# Patient Record
Sex: Male | Born: 1995 | Race: Black or African American | Hispanic: No | Marital: Single | Smoking: Never smoker
Health system: Southern US, Community
[De-identification: ages and names within clinical notes are randomized; demographics above are authoritative.]

## PROBLEM LIST (undated history)

## (undated) DIAGNOSIS — N4400 Torsion of testis, unspecified: Secondary | ICD-10-CM

## (undated) DIAGNOSIS — F419 Anxiety disorder, unspecified: Secondary | ICD-10-CM

## (undated) HISTORY — PX: WISDOM TOOTH EXTRACTION: SHX21

---
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
## 2016-09-10 ENCOUNTER — Emergency Department (HOSPITAL_COMMUNITY)
Admission: EM | Admit: 2016-09-10 | Discharge: 2016-09-10 | Disposition: A | Discharge status: Home / Self Care | Payer: Medicaid Other | Attending: Emergency Medicine | Admitting: Emergency Medicine

## 2016-09-10 ENCOUNTER — Encounter (HOSPITAL_COMMUNITY): Payer: Self-pay

## 2016-09-10 ENCOUNTER — Emergency Department (HOSPITAL_COMMUNITY): Payer: Medicaid Other

## 2016-09-10 DIAGNOSIS — F419 Anxiety disorder, unspecified: Secondary | ICD-10-CM | POA: Diagnosis not present

## 2016-09-10 DIAGNOSIS — F909 Attention-deficit hyperactivity disorder, unspecified type: Secondary | ICD-10-CM | POA: Diagnosis not present

## 2016-09-10 DIAGNOSIS — F410 Panic disorder [episodic paroxysmal anxiety] without agoraphobia: Secondary | ICD-10-CM

## 2016-09-10 LAB — CBC
HCT: 44.3 % (ref 39.0–52.0)
Hemoglobin: 15 g/dL (ref 13.0–17.0)
MCH: 32.3 pg (ref 26.0–34.0)
MCHC: 33.9 g/dL (ref 30.0–36.0)
MCV: 95.3 fL (ref 78.0–100.0)
Platelets: 204 10*3/uL (ref 150–400)
RBC: 4.65 MIL/uL (ref 4.22–5.81)
RDW: 11.6 % (ref 11.5–15.5)
WBC: 6.7 10*3/uL (ref 4.0–10.5)

## 2016-09-10 LAB — BASIC METABOLIC PANEL
Anion gap: 10 (ref 5–15)
BUN: 17 mg/dL (ref 6–20)
CO2: 22 mmol/L (ref 22–32)
Calcium: 10 mg/dL (ref 8.9–10.3)
Chloride: 108 mmol/L (ref 101–111)
Creatinine, Ser: 1.04 mg/dL (ref 0.61–1.24)
GFR calc Af Amer: >60 mL/min (ref >60)
GFR calc non Af Amer: >60 mL/min (ref >60)
Glucose, Bld: 125 mg/dL — ABNORMAL HIGH (ref 65–99)
Potassium: 3.6 mmol/L (ref 3.5–5.1)
Sodium: 140 mmol/L (ref 135–145)

## 2016-09-10 LAB — TSH: TSH: 0.489 u[IU]/mL (ref 0.350–4.500)

## 2016-09-10 MED ORDER — ALPRAZOLAM 0.25 MG PO TABS: 0.2500 mg | ORAL_TABLET | Freq: Two times a day (BID) | ORAL | 0 refills | Status: DC | PRN

## 2016-09-10 NOTE — ED Provider Notes (Signed)
[facility]-EMERGENCY DEPT Provider Note   CSN: [id number] Arrival date & time: 09/10/16  1620     History   Chief Complaint Chief Complaint  Patient presents with  . anxiety/ Heart racing    HPI Tyler Mcdowell is a 21 y.o. male.  HPI     Pt with hx anxiety, PTSD p/w episode of heart racing, chest tightness, crying, feeling emotional, N/V, generalized weakness.  He called his mother and when she got to him he was leaning on the steering wheel of his car crying and asked her "am I dead? Am I in a coma?"  The symptoms lasted about 2 hours.  Pt has hx marijuana use that caused hallucinations.  He denies any recent drug or alcohol use.  He takes Vitamin B complex and fish oil for anxiety but has not been taking it as much this week.  Was seeing a therapist once a month but has not gone in two months.  Currently feels generalized weakness but denies any CP or palpitations.  Denies recent immobilization, leg swelling, family hx of blood clots, early CAD, sudden cardiac death.    History reviewed. No pertinent past medical history.  Patient Active Problem List   Diagnosis Date Noted  . ADHD 10/23/2006    History reviewed. No pertinent surgical history.     Home Medications    Prior to Admission medications   Medication Sig Start Date End Date Taking? Authorizing Provider  ALPRAZolam (XANAX) 0.25 MG tablet Take 1 tablet (0.25 mg total) by mouth 2 (two) times daily as needed for anxiety. 09/10/16   Trixie DredgeEmily Saphia Vanderford, PA-C  cetirizine (ZYRTEC) 10 MG tablet Take 1 tablet (10 mg total) by mouth daily. For 2 weeks 02/27/15   Ree ShayJamie Deis, MD  dextromethorphan-guaiFENesin [facility](MUCINEX DM) 30-600 MG 12hr tablet Take 1 tab PO BID x 3-5 days then BID prn 05/10/15   Lowanda FosterMindy Brewer, NP  fluticasone (FLONASE) 50 MCG/ACT nasal spray Place 2 sprays into both nostrils daily. 05/10/15   Lowanda FosterMindy Brewer, NP  ibuprofen (ADVIL,MOTRIN) 600 MG tablet Take 1 tablet (600 mg total) by mouth every 8 (eight) hours as needed for  moderate pain. 04/15/14   Benjiman CoreNathan Pickering, MD  pseudoephedrine (SUDAFED) 30 MG tablet Take 1 tablet (30 mg total) by mouth every 6 (six) hours as needed for congestion. 02/27/15   Ree ShayJamie Deis, MD    Family History No family history on file.  Social History Social History  Substance Use Topics  . Smoking status: Never Smoker  . Smokeless tobacco: Never Used  . Alcohol use No     Allergies   Patient has no known allergies.   Review of Systems Review of Systems  All other systems reviewed and are negative.    Physical Exam Updated Vital Signs BP 125/74   Pulse 70   Temp 97.8 F (36.6 C) (Oral)   Resp 22   SpO2 100%   Physical Exam  Constitutional: He appears well-developed and well-nourished. No distress.  HENT:  Head: Normocephalic and atraumatic.  Neck: Neck supple.  Cardiovascular: Normal rate and regular rhythm.   Pulmonary/Chest: Effort normal and breath sounds normal. No respiratory distress. He has no wheezes. He has no rales.  Abdominal: Soft. He exhibits no distension and no mass. There is no tenderness. There is no rebound and no guarding.  Musculoskeletal: He exhibits no edema.  Neurological: He is alert. He exhibits normal muscle tone.  Skin: He is not diaphoretic.  Nursing note and vitals reviewed.  ED Treatments / Results  Labs (all labs ordered are listed, but only abnormal results are displayed) Labs Reviewed  BASIC METABOLIC PANEL - Abnormal; Notable for the following:       Result Value   Glucose, Bld 125 (*)    All other components within normal limits  CBC  TSH    EKG  EKG Interpretation None       Radiology Dg Chest 2 View  Result Date: 09/10/2016 CLINICAL DATA:  Palpitations, chest tightness EXAM: CHEST  2 VIEW COMPARISON:  None. FINDINGS: Lungs are clear.  No pleural effusion or pneumothorax. The heart is normal in size. Visualized osseous structures are within normal limits. IMPRESSION: Normal chest radiographs.  Electronically Signed   By: Charline Bills M.D.   On: 09/10/2016 19:22    Procedures Procedures (including critical care time)  Medications Ordered in ED Medications - No data to display   Initial Impression / Assessment and Plan / ED Course  I have reviewed the triage vital signs and the nursing notes.  Pertinent labs & imaging results that were available during my care of the patient were reviewed by me and considered in my medical decision making (see chart for details).     Afebrile, nontoxic patient with anxiety attack, likely exacerbated by large amount of caffeine intake today.  Workup reassuring.  Pt and mother aware TSH pending, will follow up.   D/C home with #6 xanax 0.25mg .  PCP and therapy follow up.    Discussed result, findings, treatment, and follow up  with patient.  Pt given return precautions.  Pt verbalizes understanding and agrees with plan.       Final Clinical Impressions(s) / ED Diagnoses   Final diagnoses:  Anxiety attack    New Prescriptions Discharge Medication List as of 09/10/2016  7:41 PM    START taking these medications   Details  ALPRAZolam (XANAX) 0.25 MG tablet Take 1 tablet (0.25 mg total) by mouth 2 (two) times daily as needed for anxiety., Starting Sun 09/10/2016, Print         [place], PA-C 09/10/16 1953    Raeford Razor, MD 09/11/16 1359

## 2016-09-10 NOTE — ED Notes (Signed)
ED Provider at bedside. 

## 2016-09-10 NOTE — ED Triage Notes (Addendum)
Patient complains of heart pounding, nausea, GERD symptoms and crying after leaving church today. States that he was so weak mother had to come pick him up. Patient has had increased stress and thinks related. On arrival appears anxious. No suicidal/homicidal thoughts. Alert and oriented, VSS. States anxiety symptoms in past

## 2016-09-10 NOTE — Discharge Instructions (Signed)
Read the information below.  Use the prescribed medication as directed.  Please discuss all new medications with your pharmacist.  You may return to the Emergency Department at any time for worsening condition or any new symptoms that concern you.   If you develop worsening chest pain, shortness of breath, fever, you pass out, or become weak or dizzy, return to the ER for a recheck.    °

## 2016-10-25 ENCOUNTER — Encounter (HOSPITAL_COMMUNITY): Payer: Self-pay

## 2016-10-25 ENCOUNTER — Emergency Department (HOSPITAL_COMMUNITY)
Admission: EM | Admit: 2016-10-25 | Discharge: 2016-10-25 | Disposition: A | Discharge status: Home / Self Care | Payer: Medicaid Other | Attending: Emergency Medicine | Admitting: Emergency Medicine

## 2016-10-25 DIAGNOSIS — R1084 Generalized abdominal pain: Secondary | ICD-10-CM | POA: Insufficient documentation

## 2016-10-25 DIAGNOSIS — R109 Unspecified abdominal pain: Secondary | ICD-10-CM | POA: Diagnosis present

## 2016-10-25 DIAGNOSIS — F909 Attention-deficit hyperactivity disorder, unspecified type: Secondary | ICD-10-CM | POA: Diagnosis not present

## 2016-10-25 HISTORY — DX: Torsion of testis, unspecified: N44.00

## 2016-10-25 HISTORY — DX: Anxiety disorder, unspecified: F41.9

## 2016-10-25 LAB — CBC
HCT: 43.3 % (ref 39.0–52.0)
Hemoglobin: 15 g/dL (ref 13.0–17.0)
MCH: 32.6 pg (ref 26.0–34.0)
MCHC: 34.6 g/dL (ref 30.0–36.0)
MCV: 94.1 fL (ref 78.0–100.0)
Platelets: 198 10*3/uL (ref 150–400)
RBC: 4.6 MIL/uL (ref 4.22–5.81)
RDW: 11.5 % (ref 11.5–15.5)
WBC: 7 10*3/uL (ref 4.0–10.5)

## 2016-10-25 LAB — COMPREHENSIVE METABOLIC PANEL
ALT: 16 U/L — ABNORMAL LOW (ref 17–63)
AST: 23 U/L (ref 15–41)
Albumin: 4.5 g/dL (ref 3.5–5.0)
Alkaline Phosphatase: 59 U/L (ref 38–126)
Anion gap: 9 (ref 5–15)
BUN: 14 mg/dL (ref 6–20)
CO2: 22 mmol/L (ref 22–32)
Calcium: 9.7 mg/dL (ref 8.9–10.3)
Chloride: 108 mmol/L (ref 101–111)
Creatinine, Ser: 0.87 mg/dL (ref 0.61–1.24)
GFR calc Af Amer: >60 mL/min (ref >60)
GFR calc non Af Amer: >60 mL/min (ref >60)
Glucose, Bld: 82 mg/dL (ref 65–99)
Potassium: 3.7 mmol/L (ref 3.5–5.1)
Sodium: 139 mmol/L (ref 135–145)
Total Bilirubin: 1.1 mg/dL (ref 0.3–1.2)
Total Protein: 7.1 g/dL (ref 6.5–8.1)

## 2016-10-25 LAB — URINALYSIS, ROUTINE W REFLEX MICROSCOPIC
Bacteria, UA: NONE SEEN
Bilirubin Urine: NEGATIVE
Glucose, UA: NEGATIVE mg/dL
Hgb urine dipstick: NEGATIVE
Ketones, ur: 20 mg/dL — AB
Leukocytes, UA: NEGATIVE
Nitrite: NEGATIVE
Protein, ur: 30 mg/dL — AB
Specific Gravity, Urine: 1.025 (ref 1.005–1.030)
Squamous Epithelial / LPF: NONE SEEN
pH: 8 (ref 5.0–8.0)

## 2016-10-25 LAB — LIPASE, BLOOD: Lipase: 19 U/L (ref 11–51)

## 2016-10-25 MED ORDER — ONDANSETRON HCL 4 MG PO TABS: 4.0000 mg | ORAL_TABLET | Freq: Four times a day (QID) | ORAL | 0 refills | Status: DC

## 2016-10-25 NOTE — ED Provider Notes (Signed)
[facility]-EMERG[facility]Y DEPT Provider Note   CSN: [id number] Arrival date & time: 10/25/16  1407     History   Chief Complaint Chief Complaint  Patient presents with  . Abdominal Pain    HPI Tyler Mcdowell is a 21 y.o. male.  HPI   21 year old male presents today with complaints of abdominal pain.  Mother is at bedside who helps provide history.  Patient notes that today he had an episode of nausea and nonbloody nonbilious emesis.  He notes after he had slight mid right abdominal discomfort; nonsevere nonradiating.  Patient notes since then he has had no significant changes in his baseline health.  He denies any recent fevers, continued vomiting, change in bowel or bladder habits.  No penile discharge or testicular pain.  Patient notes that last month he recently started Lexapro as needed for anxiety.  He notes symptoms significantly improved, but since then has had a decreased appetite.  He notes he has not been exercising, not eating as much.  He is uncertain if this is due to the medication were due to stress level a final switch just finished today.  Patient notes approximately 15 pound weight loss over the last month.  He denies any fevers, night sweats, lymph node swelling, chronic abdominal pain.  No personal or family history of cancer or malignancies.  Otherwise healthy male.  Past Medical History:  Diagnosis Date  . Anxiety   . Testicular torsion     Patient Active Problem List   Diagnosis Date Noted  . ADHD 10/23/2006    Past Surgical History:  Procedure Laterality Date  . WISDOM TOOTH EXTRACTION         Home Medications    Prior to Admission medications   Medication Sig Start Date End Date Taking? Authorizing Provider  ALPRAZolam (XANAX) 0.25 MG tablet Take 1 tablet (0.25 mg total) by mouth 2 (two) times daily as needed for anxiety. 09/10/16   Trixie Dredge, PA-C  cetirizine (ZYRTEC) 10 MG tablet Take 1 tablet (10 mg total) by mouth daily. For 2 weeks 02/27/15   Ree Shay, MD  dextromethorphan-guaiFENesin [facility] DM) 30-600 MG 12hr tablet Take 1 tab PO BID x 3-5 days then BID prn 05/10/15   Lowanda Foster, NP  fluticasone (FLONASE) 50 MCG/ACT nasal spray Place 2 sprays into both nostrils daily. 05/10/15   Lowanda Foster, NP  ibuprofen (ADVIL,MOTRIN) 600 MG tablet Take 1 tablet (600 mg total) by mouth every 8 (eight) hours as needed for moderate pain. 04/15/14   Benjiman Core, MD  ondansetron (ZOFRAN) 4 MG tablet Take 1 tablet (4 mg total) by mouth every 6 (six) hours. 10/25/16   Eyvonne Mechanic, PA-C  pseudoephedrine (SUDAFED) 30 MG tablet Take 1 tablet (30 mg total) by mouth every 6 (six) hours as needed for congestion. 02/27/15   Ree Shay, MD    Family History No family history on file.  Social History Social History  Substance Use Topics  . Smoking status: Never Smoker  . Smokeless tobacco: Never Used  . Alcohol use No     Allergies   Patient has no known allergies.   Review of Systems Review of Systems  Constitutional: Positive for activity change, appetite change and unexpected weight change. Negative for fever.  Gastrointestinal: Positive for abdominal pain, nausea and vomiting. Negative for abdominal distention, blood in stool, constipation and diarrhea.  Musculoskeletal: Negative for joint swelling.  Skin: Negative for color change and rash.  Hematological: Negative for adenopathy. Does not bruise/bleed easily.  All other systems reviewed and are negative.    Physical Exam Updated Vital Signs Ht  (1.905 m)   Wt 83.9 kg   BMI 23.12 kg/m   Physical Exam  Constitutional: He is oriented to person, place, and time. He appears well-developed and well-nourished.  HENT:  Head: Normocephalic and atraumatic.  Eyes: Conjunctivae are normal. Pupils are equal, round, and reactive to light. Right eye exhibits no discharge. Left eye exhibits no discharge. No scleral icterus.  Neck: Normal range of motion. No JVD present. No tracheal  deviation present.  Pulmonary/Chest: Effort normal. No stridor.  Abdominal: Soft. He exhibits no distension and no mass. There is no tenderness. There is no rebound and no guarding. No hernia.  Musculoskeletal: Normal range of motion. He exhibits no edema.  Lymphadenopathy:    He has no cervical adenopathy.  Neurological: He is alert and oriented to person, place, and time. Coordination normal.  Psychiatric: He has a normal mood and affect. His behavior is normal. Judgment and thought content normal.  Nursing note and vitals reviewed.    ED Treatments / Results  Labs (all labs ordered are listed, but only abnormal results are displayed) Labs Reviewed  COMPREHENSIVE METABOLIC PANEL - Abnormal; Notable for the following:       Result Value   ALT 16 (*)    All other components within normal limits  URINALYSIS, ROUTINE W REFLEX MICROSCOPIC - Abnormal; Notable for the following:    Ketones, ur 20 (*)    Protein, ur 30 (*)    All other components within normal limits  LIPASE, BLOOD  CBC    EKG  EKG Interpretation None       Radiology No results found.  Procedures Procedures (including critical care time)  Medications Ordered in ED Medications - No data to display   Initial Impression / Assessment and Plan / ED Course  I have reviewed the triage vital signs and the nursing notes.  Pertinent labs & imaging results that were available during my care of the patient were reviewed by me and considered in my medical decision making (see chart for details).     Final Clinical Impressions(s) / ED Diagnoses   Final diagnoses:  Abdominal pain, unspecified abdominal location    Labs: Urinalysis, lipase, CMP CBC   Discharge Meds: Zofran  Assessment/Plan: 21 year old male presents today with colitis of abdominal pain and vomiting.  Patient is very well-appearing in no acute distress.  He is afebrile with reassuring vital signs.  His laboratory analysis shows no significant  abnormalities.  His abdominal exam is benign.  Patient has no acute signs of significant intra-abdominal pathology is tolerating p.o. here no acute distress.  Mother was at bedside with the patient and mother had an in-depth discussion of potential pathology including appendicitis or acute intra-abdominal pathology.  I find this very low likely given patient is afebrile nontoxic he has no signs of white count elevation, nontender abdomen.  I discussed CT scan versus watching weight, family would like to watch and wait as patient is doing so well here.  I find this is a reasonable option in an otherwise healthy young male.  Patient is also had small amount of weight loss over the last month.  Patient has been significantly stressed with school, new medication includes antidepressive medication, and patient has not had a full appetite.  I have very low concern for any organic cause of his weight loss, likely due to recent stress.  Patient will follow  up with primary care for reassessment of this, he will return immediately to the emergency room if any new or worsening signs or symptoms present.  Patient and his mother verbalized understanding and agreement to today's plan had no further questions or concerns at time discharge.    New Prescriptions New Prescriptions   ONDANSETRON (ZOFRAN) 4 MG TABLET    Take 1 tablet (4 mg total) by mouth every 6 (six) hours.     Eyvonne Mechanic, PA-C 10/25/16 1726    Loren Racer, MD 10/30/16 [phone]

## 2016-10-25 NOTE — Discharge Instructions (Signed)
Please read attached information. If you experience any new or worsening signs or symptoms please return to the emergency room for evaluation. Please follow-up with your primary care provider or specialist as discussed. Please use medication prescribed only as directed and discontinue taking if you have any concerning signs or symptoms.   °

## 2016-10-25 NOTE — ED Triage Notes (Signed)
Pt reports generalized abdominal pain mostly around his umbilicus that started about one month ago but is progressively getting worse, associated with nausea, vomiting and weight loss. He adds that he also has been having tingling to arms and hands that started 2-3 days ago.

## 2016-10-26 ENCOUNTER — Encounter (HOSPITAL_COMMUNITY): Payer: Self-pay

## 2016-10-26 ENCOUNTER — Emergency Department (HOSPITAL_COMMUNITY)
Admission: EM | Admit: 2016-10-26 | Discharge: 2016-10-26 | Disposition: A | Discharge status: Home / Self Care | Payer: Medicaid Other | Attending: Emergency Medicine | Admitting: Emergency Medicine

## 2016-10-26 ENCOUNTER — Emergency Department (HOSPITAL_COMMUNITY): Payer: Medicaid Other

## 2016-10-26 DIAGNOSIS — R1033 Periumbilical pain: Secondary | ICD-10-CM | POA: Diagnosis not present

## 2016-10-26 DIAGNOSIS — F909 Attention-deficit hyperactivity disorder, unspecified type: Secondary | ICD-10-CM | POA: Insufficient documentation

## 2016-10-26 DIAGNOSIS — R630 Anorexia: Secondary | ICD-10-CM | POA: Insufficient documentation

## 2016-10-26 LAB — COMPREHENSIVE METABOLIC PANEL
ALBUMIN: 4.7 g/dL (ref 3.5–5.0)
ALT: 18 U/L (ref 17–63)
ANION GAP: 9 (ref 5–15)
AST: 24 U/L (ref 15–41)
Alkaline Phosphatase: 63 U/L (ref 38–126)
BUN: 12 mg/dL (ref 6–20)
CHLORIDE: 107 mmol/L (ref 101–111)
CO2: 22 mmol/L (ref 22–32)
CREATININE: 1 mg/dL (ref 0.61–1.24)
Calcium: 10.1 mg/dL (ref 8.9–10.3)
GFR calc non Af Amer: >60 mL/min (ref >60)
Glucose, Bld: 124 mg/dL — ABNORMAL HIGH (ref 65–99)
Potassium: 3.7 mmol/L (ref 3.5–5.1)
SODIUM: 138 mmol/L (ref 135–145)
Total Bilirubin: 1 mg/dL (ref 0.3–1.2)
Total Protein: 7.1 g/dL (ref 6.5–8.1)

## 2016-10-26 LAB — CBC
HCT: 43.5 % (ref 39.0–52.0)
HEMOGLOBIN: 14.8 g/dL (ref 13.0–17.0)
MCH: 31.9 pg (ref 26.0–34.0)
MCHC: 34 g/dL (ref 30.0–36.0)
MCV: 93.8 fL (ref 78.0–100.0)
PLATELETS: 198 10*3/uL (ref 150–400)
RBC: 4.64 MIL/uL (ref 4.22–5.81)
RDW: 11.4 % — ABNORMAL LOW (ref 11.5–15.5)
WBC: 5.8 10*3/uL (ref 4.0–10.5)

## 2016-10-26 LAB — LIPASE, BLOOD: LIPASE: 19 U/L (ref 11–51)

## 2016-10-26 MED ORDER — IOPAMIDOL (ISOVUE-300) INJECTION 61%
INTRAVENOUS | Status: AC
  Administered 2016-10-26: 100 mL
  Filled 2016-10-26: qty 100

## 2016-10-26 MED ORDER — OMEPRAZOLE 20 MG PO CPDR: 20.0000 mg | DELAYED_RELEASE_CAPSULE | Freq: Every day | ORAL | 0 refills | Status: DC

## 2016-10-26 NOTE — ED Provider Notes (Signed)
[facility] DEPT Provider Note   CSN: [id number] Arrival date & time: 10/26/16  1207     History   Chief Complaint Chief Complaint  Patient presents with  . Abdominal Pain    HPI Tyler Mcdowell is a 21 y.o. male.  21yo M w/ h/o anxiety who p/w abdominal pain. The patient reports a one-month history of intermittent abdominal pain that he describes as dull but occasionally becomes severe. The pain is mostly around his umbilicus and has been associated with occasional nausea and vomiting. He presented here yesterday for the same symptoms and his evaluation was reassuring. They decided not to do a CT scan at that time but he was instructed to return if his symptoms got worse. He reports that at the abdominal pain got worse at home last night and he had further vomiting. No associated constipation, diarrhea, bloody stools, fevers, chills, sweats, urinary symptoms, or cough/cold symptoms. He does note decreased appetite over the past one month and has lost approximately 15 pounds. He states that he just does not want to eat. His pain does not change with eating. He started on Lexapro 5-6 weeks ago for anxiety and this has been working well with improvement in his symptoms. He does note that the abdominal pain seems to make the anxiety worse.   The history is provided by the patient.  Abdominal Pain      Past Medical History:  Diagnosis Date  . Anxiety   . Testicular torsion     Patient Active Problem List   Diagnosis Date Noted  . ADHD 10/23/2006    Past Surgical History:  Procedure Laterality Date  . WISDOM TOOTH EXTRACTION         Home Medications    Prior to Admission medications   Medication Sig Start Date End Date Taking? Authorizing Provider  ALPRAZolam (XANAX) 0.25 MG tablet Take 1 tablet (0.25 mg total) by mouth 2 (two) times daily as needed for anxiety. 09/10/16   Trixie Dredge, PA-C  cetirizine (ZYRTEC) 10 MG tablet Take 1 tablet (10 mg total) by mouth daily.  For 2 weeks 02/27/15   Ree Shay, MD  dextromethorphan-guaiFENesin [facility]) 30-600 MG 12hr tablet Take 1 tab PO BID x 3-5 days then BID prn 05/10/15   Lowanda Foster, NP  fluticasone (FLONASE) 50 MCG/ACT nasal spray Place 2 sprays into both nostrils daily. 05/10/15   Lowanda Foster, NP  ibuprofen (ADVIL,MOTRIN) 600 MG tablet Take 1 tablet (600 mg total) by mouth every 8 (eight) hours as needed for moderate pain. 04/15/14   Benjiman Core, MD  omeprazole (PRILOSEC) 20 MG capsule Take 1 capsule (20 mg total) by mouth daily. 10/26/16   Laurence Spates, MD  ondansetron (ZOFRAN) 4 MG tablet Take 1 tablet (4 mg total) by mouth every 6 (six) hours. 10/25/16   Eyvonne Mechanic, PA-C  pseudoephedrine (SUDAFED) 30 MG tablet Take 1 tablet (30 mg total) by mouth every 6 (six) hours as needed for congestion. 02/27/15   Ree Shay, MD    Family History History reviewed. No pertinent family history.  Social History Social History  Substance Use Topics  . Smoking status: Never Smoker  . Smokeless tobacco: Never Used  . Alcohol use No     Allergies   Patient has no known allergies.   Review of Systems Review of Systems  Gastrointestinal: Positive for abdominal pain.   All other systems reviewed and are negative except that which was mentioned in HPI  Physical Exam Updated Vital  Signs BP 126/84   Pulse (!) 37   Temp 97.5 F (36.4 C) (Oral)   Resp 20   Ht  (1.905 m)   Wt 185 lb (83.9 kg)   SpO2 99%   BMI 23.12 kg/m   Physical Exam  Constitutional: He is oriented to person, place, and time. He appears well-developed and well-nourished. No distress.  HENT:  Head: Normocephalic and atraumatic.  Moist mucous membranes  Eyes: Conjunctivae are normal. Pupils are equal, round, and reactive to light.  Neck: Neck supple.  Cardiovascular: Normal rate, regular rhythm and normal heart sounds.   No murmur heard. Pulmonary/Chest: Effort normal and breath sounds normal.  Abdominal: Soft.  Bowel sounds are normal. He exhibits no distension and no mass. There is tenderness in the right upper quadrant, epigastric area and periumbilical area. There is no rebound and no guarding.  Musculoskeletal: He exhibits no edema.  Neurological: He is alert and oriented to person, place, and time.  Fluent speech  Skin: Skin is warm and dry.  Psychiatric: He has a normal mood and affect. Judgment normal.  Nursing note and vitals reviewed.    ED Treatments / Results  Labs (all labs ordered are listed, but only abnormal results are displayed) Labs Reviewed  COMPREHENSIVE METABOLIC PANEL - Abnormal; Notable for the following:       Result Value   Glucose, Bld 124 (*)    All other components within normal limits  CBC - Abnormal; Notable for the following:    RDW 11.4 (*)    All other components within normal limits  LIPASE, BLOOD  URINALYSIS, ROUTINE W REFLEX MICROSCOPIC    EKG  EKG Interpretation None       Radiology Ct Abdomen Pelvis W Contrast  Result Date: 10/26/2016 CLINICAL DATA:  21 year old male with periumbilical abdominal and pelvic pain and weight loss. EXAM: CT ABDOMEN AND PELVIS WITH CONTRAST TECHNIQUE: Multidetector CT imaging of the abdomen and pelvis was performed using the standard protocol following bolus administration of intravenous contrast. CONTRAST:  ISOVUE-300 IOPAMIDOL (ISOVUE-300) INJECTION 61% COMPARISON:  None. FINDINGS: Lower chest: Unremarkable Hepatobiliary: The liver and gallbladder are unremarkable. There is no evidence of biliary dilatation. Pancreas: Unremarkable Spleen: Unremarkable Adrenals/Urinary Tract: The kidneys, adrenal glands and bladder are unremarkable. Stomach/Bowel: Stomach is within normal limits. Appendix appears normal. No evidence of bowel wall thickening, distention, or inflammatory changes. Vascular/Lymphatic: No significant vascular findings are present. No enlarged abdominal or pelvic lymph nodes. Reproductive: Prostate is  unremarkable. Other: No abdominal wall hernia or abnormality. No abdominopelvic ascites. Musculoskeletal: No acute or significant osseous findings. IMPRESSION: Unremarkable CT of the abdomen and pelvis with contrast. Electronically Signed   By: Harmon Pier M.D.   On: 10/26/2016 16:44    Procedures Procedures (including critical care time)  Medications Ordered in ED Medications  iopamidol (ISOVUE-300) 61 % injection (100 mLs  Contrast Given 10/26/16 1632)     Initial Impression / Assessment and Plan / ED Course  I have reviewed the triage vital signs and the nursing notes.  Pertinent labs & imaging results that were available during my care of the patient were reviewed by me and considered in my medical decision making (see chart for details).     Pt w/ 1 month of intermittent peri-umbilical abdominal pain, no association with eating. He has had some nausea and vomiting but no changes in bowel movements and no fevers. He was well-appearing and comfortable on exam with normal vital signs. He had mild tenderness  to deep palpation of the umbilical abdomen as well as midepigastrium and right upper quadrants. He was evaluated yesterday with normal lab work and at that time declined CT scan. I explained that the CT scan would rule out acute intra-abdominal process such as obstruction, mass, or appendicitis but I did explain that my suspicion of these etiologies is low given the chronicity of his symptoms. I discussed benefits of CT scan versus risk of radiation exposure. He and his mom voiced understanding and they wanted to proceed.  Labwork unremarkable. I reviewed lab work from March which shows normal TSH. CT with no acute findings to explain the patient's symptoms. I explained that he may need a scope by gastroenterology to evaluate for intraluminal process such as peptic ulcer, gastritis, or rarely IBD. I will start the patient on a PPI to see if it reduces his symptoms and I have discussed low  acid diet.  Discussed with family the importance of following up with PCP for gastroenterology referral and discussed dietary options for high calorie foods. Extensively reviewed return precautions. Patient voiced understanding and was discharged in satisfactory condition. Final Clinical Impressions(s) / ED Diagnoses   Final diagnoses:  Periumbilical abdominal pain  Decreased appetite    New Prescriptions Discharge Medication List as of 10/26/2016  5:09 PM    START taking these medications   Details  omeprazole (PRILOSEC) 20 MG capsule Take 1 capsule (20 mg total) by mouth daily., Starting Thu 10/26/2016, Print         Laurence Spates, MD 10/26/16 1728

## 2016-10-26 NOTE — ED Notes (Signed)
Pt stable, understands discharge instructions, and reasons for return.   

## 2016-10-26 NOTE — ED Triage Notes (Signed)
Pt presents to the ed with complaints of abdominal pain and loss of appetite.  He was seen here yesterday for the same and all was negative but was told to come back today if he got worse.  States he has lost 20 lbs in 3 weeks due to feeling like this. Reports his anxiety is getting worse because he feels so bad

## 2016-11-10 ENCOUNTER — Encounter (HOSPITAL_COMMUNITY): Payer: Self-pay | Admitting: *Deleted

## 2016-11-10 DIAGNOSIS — N50811 Right testicular pain: Secondary | ICD-10-CM | POA: Insufficient documentation

## 2016-11-10 DIAGNOSIS — F909 Attention-deficit hyperactivity disorder, unspecified type: Secondary | ICD-10-CM | POA: Insufficient documentation

## 2016-11-10 LAB — URINALYSIS, ROUTINE W REFLEX MICROSCOPIC
BILIRUBIN URINE: NEGATIVE
Glucose, UA: NEGATIVE mg/dL
HGB URINE DIPSTICK: NEGATIVE
KETONES UR: NEGATIVE mg/dL
Leukocytes, UA: NEGATIVE
Nitrite: NEGATIVE
PROTEIN: NEGATIVE mg/dL
Specific Gravity, Urine: 1.03 (ref 1.005–1.030)
pH: 5 (ref 5.0–8.0)

## 2016-11-10 LAB — CBC
HCT: 43.4 % (ref 39.0–52.0)
Hemoglobin: 14.5 g/dL (ref 13.0–17.0)
MCH: 31.8 pg (ref 26.0–34.0)
MCHC: 33.4 g/dL (ref 30.0–36.0)
MCV: 95.2 fL (ref 78.0–100.0)
PLATELETS: 208 10*3/uL (ref 150–400)
RBC: 4.56 MIL/uL (ref 4.22–5.81)
RDW: 11.6 % (ref 11.5–15.5)
WBC: 7.5 10*3/uL (ref 4.0–10.5)

## 2016-11-10 LAB — LIPASE, BLOOD: Lipase: 18 U/L (ref 11–51)

## 2016-11-10 NOTE — ED Triage Notes (Signed)
The pt is c/o testicle pain for 3 days  No known injury  Hx of  A torsion of the testicle

## 2016-11-11 ENCOUNTER — Emergency Department (HOSPITAL_COMMUNITY): Payer: Medicaid Other

## 2016-11-11 ENCOUNTER — Emergency Department (HOSPITAL_COMMUNITY)
Admission: EM | Admit: 2016-11-11 | Discharge: 2016-11-11 | Disposition: A | Discharge status: Home / Self Care | Payer: Medicaid Other | Attending: Emergency Medicine | Admitting: Emergency Medicine

## 2016-11-11 DIAGNOSIS — N50819 Testicular pain, unspecified: Secondary | ICD-10-CM

## 2016-11-11 LAB — COMPREHENSIVE METABOLIC PANEL
ALT: 15 U/L — AB (ref 17–63)
AST: 21 U/L (ref 15–41)
Albumin: 4.7 g/dL (ref 3.5–5.0)
Alkaline Phosphatase: 69 U/L (ref 38–126)
Anion gap: 8 (ref 5–15)
BUN: 17 mg/dL (ref 6–20)
CALCIUM: 9.8 mg/dL (ref 8.9–10.3)
CO2: 23 mmol/L (ref 22–32)
CREATININE: 0.89 mg/dL (ref 0.61–1.24)
Chloride: 109 mmol/L (ref 101–111)
GFR calc non Af Amer: >60 mL/min (ref >60)
Glucose, Bld: 96 mg/dL (ref 65–99)
Potassium: 3.7 mmol/L (ref 3.5–5.1)
SODIUM: 140 mmol/L (ref 135–145)
Total Bilirubin: 0.3 mg/dL (ref 0.3–1.2)
Total Protein: 7.2 g/dL (ref 6.5–8.1)

## 2016-11-11 NOTE — Discharge Instructions (Signed)
Wear close fitting briefs. Follow up with urology, return for sudden worsening pain

## 2016-11-11 NOTE — ED Provider Notes (Signed)
[facility] DEPT Provider Note   CSN: [id number] Arrival date & time: 11/10/16  2301  By signing my name below, I, Modena JanskyAlbert Thayil, attest that this documentation has been prepared under the direction and in the presence of Melene PlanFloyd, Micah Galeno, DO. Electronically Signed: Modena JanskyAlbert Thayil, Scribe. 11/11/2016. 12:41 AM.  History   Chief Complaint Chief Complaint  Patient presents with  . Testicle Pain   The history is provided by the patient. No language interpreter was used.  Testicle Pain  This is a recurrent problem. The current episode started more than 2 days ago. The problem occurs daily. The problem has been gradually worsening. Pertinent negatives include no chest pain, no abdominal pain, no headaches and no shortness of breath. Nothing aggravates the symptoms. The symptoms are relieved by ice. Treatments tried: ice. The treatment provided mild relief.    HPI Comments: Tyler Mcdowell is a 21 y.o. male who presents to the Emergency Department complaining of intermittent moderate testicle pain that started about 3 days ago. 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 No known recent trauma. His pain is relieved by ice. No exacerating factors. Denies any rash, hematuria, penile discharge, penile swelling, or other complaints at this time.    PCP: Donato SchultzLowne Chase, Yvonne R, DO  Past Medical History:  Diagnosis Date  . Anxiety   . Testicular torsion     Patient Active Problem List   Diagnosis Date Noted  . ADHD 10/23/2006    Past Surgical History:  Procedure Laterality Date  . WISDOM TOOTH EXTRACTION         Home Medications    Prior to Admission medications   Medication Sig Start Date End Date Taking? Authorizing Provider  ALPRAZolam (XANAX) 0.25 MG tablet Take 1 tablet (0.25 mg total) by mouth 2 (two) times daily as needed for anxiety. 09/10/16   Trixie DredgeWest, Emily, PA-C  cetirizine (ZYRTEC) 10 MG tablet Take 1 tablet (10 mg total) by mouth daily. For 2  weeks 02/27/15   Ree Shayeis, Jamie, MD  dextromethorphan-guaiFENesin [facility] DM) 30-600 MG 12hr tablet Take 1 tab PO BID x 3-5 days then BID prn 05/10/15   Lowanda FosterBrewer, Mindy, NP  fluticasone (FLONASE) 50 MCG/ACT nasal spray Place 2 sprays into both nostrils daily. 05/10/15   Lowanda FosterBrewer, Mindy, NP  ibuprofen (ADVIL,MOTRIN) 600 MG tablet Take 1 tablet (600 mg total) by mouth every 8 (eight) hours as needed for moderate pain. 04/15/14   Benjiman CorePickering, Nathan, MD  omeprazole (PRILOSEC) 20 MG capsule Take 1 capsule (20 mg total) by mouth daily. 10/26/16   Little, Ambrose Finlandachel Morgan, MD  ondansetron (ZOFRAN) 4 MG tablet Take 1 tablet (4 mg total) by mouth every 6 (six) hours. 10/25/16   Hedges, Tinnie GensJeffrey, PA-C  pseudoephedrine (SUDAFED) 30 MG tablet Take 1 tablet (30 mg total) by mouth every 6 (six) hours as needed for congestion. 02/27/15   Ree Shayeis, Jamie, MD    Family History No family history on file.  Social History Social History  Substance Use Topics  . Smoking status: Never Smoker  . Smokeless tobacco: Never Used  . Alcohol use No     Allergies   Patient has no known allergies.   Review of Systems Review of Systems  Constitutional: Negative for chills and fever.  HENT: Negative for congestion and facial swelling.   Eyes: Negative for discharge and visual disturbance.  Respiratory: Negative for shortness of breath.   Cardiovascular: Negative for chest pain and palpitations.  Gastrointestinal: Negative for abdominal pain, diarrhea  and vomiting.  Genitourinary: Positive for testicular pain. Negative for discharge, hematuria, penile pain, penile swelling and scrotal swelling.  Musculoskeletal: Negative for arthralgias and myalgias.  Skin: Negative for color change and rash.  Neurological: Negative for tremors, syncope and headaches.  Psychiatric/Behavioral: Negative for confusion and dysphoric mood.     Physical Exam Updated Vital Signs BP 130/64 (BP Location: Left Arm)   Pulse (!) 57   Temp 98.8 F (37.1  C) (Oral)   Resp 20   SpO2 99%   Physical Exam  Constitutional: He is oriented to person, place, and time. He appears well-developed and well-nourished.  HENT:  Head: Normocephalic and atraumatic.  Eyes: EOM are normal. Pupils are equal, round, and reactive to light.  Neck: Normal range of motion. Neck supple. No JVD present.  Cardiovascular: Normal rate and regular rhythm.  Exam reveals no gallop and no friction rub.   No murmur heard. Pulmonary/Chest: No respiratory distress. He has no wheezes.  Abdominal: He exhibits no distension. There is no rebound and no guarding.  Genitourinary:  Genitourinary Comments: +Cremaster reflex. Tenderness of the posterior pull of the testicle. No hernias or masses.   Musculoskeletal: Normal range of motion.  Neurological: He is alert and oriented to person, place, and time.  Skin: No rash noted. No pallor.  Psychiatric: He has a normal mood and affect. His behavior is normal.  Nursing note and vitals reviewed.    ED Treatments / Results  DIAGNOSTIC STUDIES: Oxygen Saturation is 99% on RA, normal by my interpretation.    COORDINATION OF CARE: 12:45 AM- Pt advised of plan for treatment and pt agrees.  Labs (all labs ordered are listed, but only abnormal results are displayed) Labs Reviewed  COMPREHENSIVE METABOLIC PANEL - Abnormal; Notable for the following:       Result Value   ALT 15 (*)    All other components within normal limits  URINALYSIS, ROUTINE W REFLEX MICROSCOPIC - Abnormal; Notable for the following:    APPearance HAZY (*)    All other components within normal limits  LIPASE, BLOOD  CBC    EKG  EKG Interpretation None       Radiology US Scrotum  Result Date: 11/11/2016 CLINICAL DATA:  Acute onset of right scrotal pain. Status post left-sided orchiectomy. Initial encounter. EXAM: SCROTAL ULTRASOUND DOPPLER ULTRASOUND OF THE TESTICLES TECHNIQUE: Complete ultrasound examination of the testicles, epididymis, and other  scrotal structures was performed. Color and spectral Doppler ultrasound were also utilized to evaluate blood flow to the testicles. COMPARISON:  None. FINDINGS: Right testicle Measurements: 5.2 x 2.5 x 3.5 cm. No mass or microlithiasis visualized. Left testicle Status post left-sided orchiectomy. Right epididymis:  Normal in size and appearance. Left epididymis:  N/A Hydrocele:  None visualized. Varicocele:  None visualized. Pulsed Doppler interrogation of the right testis demonstrates normal low resistance arterial and venous waveforms. IMPRESSION: Right testis is unremarkable in appearance. No evidence of testicular torsion. Status post left-sided orchiectomy. Electronically Signed   By: Roanna Raider M.D.   On: 11/11/2016 02:25   [place] Art/ven Flow Abd Pelv Doppler  Result Date: 11/11/2016 CLINICAL DATA:  Acute onset of right scrotal pain. Status post left-sided orchiectomy. Initial encounter. EXAM: SCROTAL ULTRASOUND DOPPLER ULTRASOUND OF THE TESTICLES TECHNIQUE: Complete ultrasound examination of the testicles, epididymis, and other scrotal structures was performed. Color and spectral Doppler ultrasound were also utilized to evaluate blood flow to the testicles. COMPARISON:  None. FINDINGS: Right testicle Measurements: 5.2 x 2.5 x 3.5 cm. No  mass or microlithiasis visualized. Left testicle Status post left-sided orchiectomy. Right epididymis:  Normal in size and appearance. Left epididymis:  N/A Hydrocele:  None visualized. Varicocele:  None visualized. Pulsed Doppler interrogation of the right testis demonstrates normal low resistance arterial and venous waveforms. IMPRESSION: Right testis is unremarkable in appearance. No evidence of testicular torsion. Status post left-sided orchiectomy. Electronically Signed   By: Roanna Raider M.D.   On: 11/11/2016 02:25    Procedures Procedures (including critical care time)  Medications Ordered in ED Medications - No data to display   Initial Impression /  Assessment and Plan / ED Course  I have reviewed the triage vital signs and the nursing notes.  Pertinent labs & imaging results that were available during my care of the patient were reviewed by me and considered in my medical decision making (see chart for details).     21 yo M with a chief complaints of right testicular pain. Going on for the past 3 days. Has a history of a testicular torsion of the other testicle. Ultrasound here with out torsion. Exam was benign. I do not feel that he is torsing and detorsing. Regardless we'll have him follow with urology. Scrotal elevation and NSAIDs.  Final Clinical Impressions(s) / ED Diagnoses   Final diagnoses:  Testicular pain    New Prescriptions Discharge Medication List as of 11/11/2016  2:30 AM     I personally performed the services described in this documentation, which was scribed in my presence. The recorded information has been reviewed and is accurate.      Melene Plan, DO 11/11/16 [phone]

## 2016-12-29 ENCOUNTER — Encounter: Payer: Self-pay | Admitting: Gastroenterology

## 2017-02-15 ENCOUNTER — Encounter: Payer: Self-pay | Admitting: Gastroenterology

## 2017-02-15 ENCOUNTER — Ambulatory Visit (INDEPENDENT_AMBULATORY_CARE_PROVIDER_SITE_OTHER): Payer: Medicaid Other | Admitting: Gastroenterology

## 2017-02-15 ENCOUNTER — Encounter (INDEPENDENT_AMBULATORY_CARE_PROVIDER_SITE_OTHER): Payer: Self-pay

## 2017-02-15 VITALS — BP 94/64 | HR 64 | Ht 75.0 in | Wt 179.2 lb

## 2017-02-15 DIAGNOSIS — R142 Eructation: Secondary | ICD-10-CM | POA: Diagnosis not present

## 2017-02-15 NOTE — Progress Notes (Signed)
[facility] Consult Note:  History: Tyler Mcdowell 02/15/2017  Referring physician: self referred  Reason for consult/chief complaint: Abdominal Pain (LUQ x 1 1/2 weeks - now subsided.  Pt c/o belching x 2-3 months.  No other complaints.)   Subjective  HPI:  This is a 21 year old man accompanied by his mother who requested evaluation for frequent belching. In March and April of this year he was seen in the emergency department 3 times for abdominal pain coinciding with severe panic attacks. He apparently suffered from PTSD and anxiety disorder. He was briefly on low-dose Lexapro but says his anxiety disorder got much better and he no longer needed medicine. There was some left upper quadrant pain that has since resolved. He now belches, was times during the day and finds it embarrassing. He denies dysphagia, odynophagia, early satiety, nausea, vomiting, weight loss, altered bowel habits or rectal bleeding. He does not smoke but he chews gum frequently, and was doing so in the clinic today. He was observed belching frequently during the exam.  ROS:  Review of Systems  Constitutional: Negative for appetite change and unexpected weight change.  HENT: Negative for mouth sores and voice change.   Eyes: Negative for pain and redness.  Respiratory: Negative for cough and shortness of breath.   Cardiovascular: Negative for chest pain and palpitations.  Genitourinary: Negative for dysuria and hematuria.  Musculoskeletal: Negative for arthralgias and myalgias.  Skin: Negative for pallor and rash.  Neurological: Negative for weakness and headaches.  Hematological: Negative for adenopathy.     Past Medical History: Past Medical History:  Diagnosis Date  . Anxiety   . Testicular torsion      Past Surgical History: Past Surgical History:  Procedure Laterality Date  . WISDOM TOOTH EXTRACTION       Family History: Family History  Problem Relation Age of  Onset  . Colon cancer Neg Hx   . Rectal cancer Neg Hx   . Esophageal cancer Neg Hx   . Liver cancer Neg Hx     Social History: Social History   Social History  . Marital status: Single    Spouse name: N/A  . Number of children: 0  . Years of education: N/A   Occupational History  . student    Social History Main Topics  . Smoking status: Never Smoker  . Smokeless tobacco: Never Used  . Alcohol use No  . Drug use: No  . Sexual activity: Not Asked   Other Topics Concern  . None   Social History Narrative  . None    Allergies: No Known Allergies  Outpatient Meds: No current outpatient prescriptions on file.   No current facility-administered medications for this visit.       ___________________________________________________________________ Objective   Exam:  BP 94/64   Pulse 64   Ht 6\' 3"  (1.905 m)   Wt 179 lb 4 oz (81.3 kg)   BMI 22.40 kg/m    General: this is a(n) Healthy and well-appearing man, no signs malnutrition   Eyes: sclera anicteric, no redness  ENT: oral mucosa moist without lesions, no cervical or supraclavicular lymphadenopathy, good dentition  CV: RRR without murmur, S1/S2, no JVD, no peripheral edema  Resp: clear to auscultation bilaterally, normal RR and effort noted  GI: soft, No tenderness, with active bowel sounds. No guarding or palpable organomegaly noted.  Skin; warm and dry, no rash or jaundice noted  Neuro: awake, alert and oriented x 3. Normal gross motor function  and fluent speech  Labs:  CBC Latest Ref Rng & Units 11/10/2016 10/26/2016 10/25/2016  WBC 4.0 - 10.5 K/uL 7.5 5.8 7.0  Hemoglobin 13.0 - 17.0 g/dL 40.9 81.1 91.4  Hematocrit 39.0 - 52.0 % 43.4 43.5 43.3  Platelets 150 - 400 K/uL 208 198 198   CMP Latest Ref Rng & Units 11/10/2016 10/26/2016 10/25/2016  Glucose 65 - 99 mg/dL 96 782(N) 82  BUN 6 - 20 mg/dL 17 12 14   Creatinine 0.61 - 1.24 mg/dL 5.62 1.30 8.65  Sodium 135 - 145 mmol/L 140 138 139    Potassium 3.5 - 5.1 mmol/L 3.7 3.7 3.7  Chloride 101 - 111 mmol/L 109 107 108  CO2 22 - 32 mmol/L 23 22 22   Calcium 8.9 - 10.3 mg/dL 9.8 78.4 9.7  Total Protein 6.5 - 8.1 g/dL 7.2 7.1 7.1  Total Bilirubin 0.3 - 1.2 mg/dL 0.3 1.0 1.1  Alkaline Phos 38 - 126 U/L 69 63 59  AST 15 - 41 U/L 21 24 23   ALT 17 - 63 U/L 15(L) 18 16(L)     Radiologic Studies:  CTAP w/ contrast 10/26/16 normal  TSH nml  Assessment: Encounter Diagnosis  Name Primary?  . Belching Yes   This patient is well-appearing with no other digestive symptoms, and I think this is benign. I explained to him and his mother how this is typically occurring due to aerophagia. I think no further testing is necessary, and I have reassured him. I also asked him to stop chewing gum.  Plan:  See me as needed  Thank you for the courtesy of this consult.  Please call me with any questions or concerns.  [name]  CC: Donato Schultz, DO

## 2017-02-15 NOTE — Patient Instructions (Signed)
If you are age 20 or older, your body mass index should be between 23-30. Your Body mass index is 22.4 kg/m. If this is out of the aforementioned range listed, please consider follow up with your Primary Care Provider.  If you are age 20 or younger, your body mass index should be between 19-25. Your Body mass index is 22.4 kg/m. If this is out of the aformentioned range listed, please consider follow up with your Primary Care Provider.   Follow up as needed.   Thank you for choosing [facility]  Dr Amada JupiterHenry Danis III

## 2017-10-19 ENCOUNTER — Emergency Department (HOSPITAL_COMMUNITY)
Admission: EM | Admit: 2017-10-19 | Discharge: 2017-10-20 | Disposition: A | Discharge status: Home / Self Care | Payer: Medicaid Other | Attending: Emergency Medicine | Admitting: Emergency Medicine

## 2017-10-19 ENCOUNTER — Other Ambulatory Visit: Payer: Self-pay

## 2017-10-19 ENCOUNTER — Encounter (HOSPITAL_COMMUNITY): Payer: Self-pay

## 2017-10-19 DIAGNOSIS — M545 Low back pain, unspecified: Secondary | ICD-10-CM

## 2017-10-19 DIAGNOSIS — R51 Headache: Secondary | ICD-10-CM | POA: Insufficient documentation

## 2017-10-19 DIAGNOSIS — M546 Pain in thoracic spine: Secondary | ICD-10-CM | POA: Insufficient documentation

## 2017-10-19 DIAGNOSIS — Z041 Encounter for examination and observation following transport accident: Secondary | ICD-10-CM | POA: Diagnosis present

## 2017-10-19 DIAGNOSIS — R519 Headache, unspecified: Secondary | ICD-10-CM

## 2017-10-19 DIAGNOSIS — M542 Cervicalgia: Secondary | ICD-10-CM | POA: Diagnosis not present
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
## 2017-10-20 MED ORDER — ACETAMINOPHEN 500 MG PO TABS
1000.0000 mg | ORAL_TABLET | Freq: Once | ORAL | Status: AC
  Administered 2017-10-20: 1000 mg via ORAL
  Filled 2017-10-20: qty 2

## 2017-10-20 MED ORDER — NAPROXEN 375 MG PO TABS: 375.0000 mg | ORAL_TABLET | Freq: Two times a day (BID) | ORAL | 0 refills | Status: AC

## 2017-10-20 MED ORDER — CYCLOBENZAPRINE HCL 10 MG PO TABS: 10.0000 mg | ORAL_TABLET | Freq: Two times a day (BID) | ORAL | 0 refills | Status: AC | PRN

## 2017-10-20 MED ORDER — LIDOCAINE 5 % EX PTCH: 1.0000 | MEDICATED_PATCH | CUTANEOUS | 0 refills | Status: AC

## 2017-10-20 MED ORDER — NAPROXEN 500 MG PO TABS
500.0000 mg | ORAL_TABLET | Freq: Once | ORAL | Status: AC
  Administered 2017-10-20: 500 mg via ORAL
  Filled 2017-10-20: qty 1

## 2017-10-20 NOTE — Discharge Instructions (Addendum)
Your symptoms like related to musculoskeletal pain. You may also have a concussion. You have been diagnosed with a concussion.  Please take the Naproxen as prescribed for pain. Do not take any additional NSAIDs including Motrin, Aleve, Ibuprofen, Advil. Please the the flexeril for muscle relaxation. This medication will make you drowsy so avoid situation that could place you in danger. May also take tylenol.  Have given you lidocaine patches to help.  Warm compresses to the affected area.  Warm soaks and Epsom salt.  Rest, ice on head.  Stay in a quiet, non-simulating, dark environment. No TV, computer use, video games until headache is resolved completely.  Follow up with your primary care physician if headache persists.  Return to the emergency department if patient becomes lethargic, begins vomiting or other change in mental status.  HEAD INJURY If any of the following occur notify your physician or go to the Hospital Emergency Department:  Increased drowsiness, stupor or loss of consciousness  Temperature above 100 F  Vomiting  Severe headache  Blood or clear fluid dripping from the nose or ears  Stiffness of the neck  Dizziness or blurred vision  Any other unusual symptoms  PRECAUTIONS  Keep head elevated at all times for the first 24 hours (Elevate mattress if pillow is ineffective)  Do not take sedatives, narcotics or alcohol  Avoid aspirin. Use only acetaminophen (e.g. Tylenol) or ibuprofen (e.g. Advil) for relief of pain. Follow directions on the bottle for dosage.  Use ice packs for comfort
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
## 2018-04-02 ENCOUNTER — Other Ambulatory Visit: Payer: Self-pay | Admitting: *Deleted

## 2018-04-02 ENCOUNTER — Ambulatory Visit
Admission: RE | Admit: 2018-04-02 | Discharge: 2018-04-02 | Disposition: A | Discharge status: Home / Self Care | Payer: Medicaid Other | Source: Ambulatory Visit | Attending: *Deleted | Admitting: *Deleted

## 2018-04-02 DIAGNOSIS — R0602 Shortness of breath: Secondary | ICD-10-CM

## 2018-04-04 DIAGNOSIS — R05 Cough: Secondary | ICD-10-CM | POA: Insufficient documentation

## 2018-04-04 DIAGNOSIS — R062 Wheezing: Secondary | ICD-10-CM | POA: Insufficient documentation

## 2018-04-04 DIAGNOSIS — R0602 Shortness of breath: Secondary | ICD-10-CM | POA: Insufficient documentation

## 2018-04-04 DIAGNOSIS — Z79899 Other long term (current) drug therapy: Secondary | ICD-10-CM | POA: Insufficient documentation

## 2018-04-05 ENCOUNTER — Other Ambulatory Visit: Payer: Self-pay

## 2018-04-05 ENCOUNTER — Emergency Department (HOSPITAL_COMMUNITY): Payer: Self-pay

## 2018-04-05 ENCOUNTER — Encounter (HOSPITAL_COMMUNITY): Payer: Self-pay | Admitting: *Deleted

## 2018-04-05 ENCOUNTER — Emergency Department (HOSPITAL_COMMUNITY)
Admission: EM | Admit: 2018-04-05 | Discharge: 2018-04-05 | Disposition: A | Discharge status: Home / Self Care | Payer: Self-pay | Attending: Emergency Medicine | Admitting: Emergency Medicine

## 2018-04-05 DIAGNOSIS — R062 Wheezing: Secondary | ICD-10-CM

## 2018-04-05 DIAGNOSIS — R0602 Shortness of breath: Secondary | ICD-10-CM

## 2018-04-05 MED ORDER — ALBUTEROL SULFATE (2.5 MG/3ML) 0.083% IN NEBU
5.0000 mg | INHALATION_SOLUTION | Freq: Once | RESPIRATORY_TRACT | Status: AC
  Administered 2018-04-05: 5 mg via RESPIRATORY_TRACT
  Filled 2018-04-05: qty 6

## 2018-04-05 MED ORDER — PREDNISONE 10 MG PO TABS: 40.0000 mg | ORAL_TABLET | Freq: Every day | ORAL | 0 refills | Status: AC

## 2018-04-05 MED ORDER — AEROCHAMBER PLUS FLO-VU MEDIUM MISC
1.0000 | Freq: Once | Status: AC
  Administered 2018-04-05: 1
  Filled 2018-04-05: qty 1

## 2018-04-05 MED ORDER — ONDANSETRON 4 MG PO TBDP
4.0000 mg | ORAL_TABLET | Freq: Once | ORAL | Status: AC
  Administered 2018-04-05: 4 mg via ORAL
  Filled 2018-04-05: qty 1

## 2018-04-05 MED ORDER — IPRATROPIUM BROMIDE 0.02 % IN SOLN
0.5000 mg | Freq: Once | RESPIRATORY_TRACT | Status: AC
  Administered 2018-04-05: 0.5 mg via RESPIRATORY_TRACT
  Filled 2018-04-05: qty 2.5

## 2018-04-05 MED ORDER — ALBUTEROL SULFATE HFA 108 (90 BASE) MCG/ACT IN AERS
2.0000 | INHALATION_SPRAY | RESPIRATORY_TRACT | Status: DC | PRN
  Administered 2018-04-05: 2 via RESPIRATORY_TRACT
  Filled 2018-04-05: qty 6.7

## 2018-04-05 MED ORDER — ALBUTEROL SULFATE (2.5 MG/3ML) 0.083% IN NEBU
5.0000 mg | INHALATION_SOLUTION | Freq: Once | RESPIRATORY_TRACT | Status: AC
  Administered 2018-04-05: 5 mg via RESPIRATORY_TRACT

## 2018-04-05 MED ORDER — ALBUTEROL SULFATE (2.5 MG/3ML) 0.083% IN NEBU
INHALATION_SOLUTION | RESPIRATORY_TRACT | Status: AC
  Filled 2018-04-05: qty 6

## 2018-04-05 MED ORDER — PREDNISONE 20 MG PO TABS
60.0000 mg | ORAL_TABLET | Freq: Once | ORAL | Status: AC
  Administered 2018-04-05: 60 mg via ORAL
  Filled 2018-04-05: qty 3

## 2018-04-05 NOTE — ED Notes (Signed)
Patient states he dont feel good and has been having trouble breathing. Patient O2 Sat is 98%-100%

## 2018-04-05 NOTE — ED Provider Notes (Signed)
[facility]-EMERGENCY DEPT Provider Note   CSN: [id number] Arrival date & time: 04/04/18  2328     History   Chief Complaint Chief Complaint  Patient presents with  . Shortness of Breath    HPI Tyler Mcdowell is a 22 y.o. male with a hx of anxiety presents to the Emergency Department complaining of gradual, persistent, progressively worsening SOB onset 2 weeks ago. Associated symptoms include chest tightness, nasal congestion and cough.  Pt reports he occasionally smokes black and mild cigars, but reports he does not vape anymore.  He reports months since he vaped last.  Pt reports no hx of asthma and has never had a prescription for Albuterol.  Nothing specifically aggravates or alleviates his symptoms.  Pt denies fever, chills, headache, neck pain, chest pain, abd pain, N/V/D, weakness, dizziness, syncope.  He denies hemoptysis, palpitations, leg swelling, syncope, periods of immobilization, recent surgery, hx of DVT.  Marland Kitchen     The history is provided by the patient and medical records. No language interpreter was used.    Past Medical History:  Diagnosis Date  . Anxiety   . Testicular torsion     Patient Active Problem List   Diagnosis Date Noted  . ADHD 10/23/2006    Past Surgical History:  Procedure Laterality Date  . WISDOM TOOTH EXTRACTION          Home Medications    Prior to Admission medications   Medication Sig Start Date End Date Taking? Authorizing Provider  cyclobenzaprine (FLEXERIL) 10 MG tablet Take 1 tablet (10 mg total) by mouth 2 (two) times daily as needed. 10/20/17   Demetrios Loll T, PA-C  lidocaine (LIDODERM) 5 % Place 1 patch onto the skin daily. Remove & Discard patch within 12 hours or as directed by MD 10/20/17   Rise Mu, PA-C  naproxen (NAPROSYN) 375 MG tablet Take 1 tablet (375 mg total) by mouth 2 (two) times daily. 10/20/17   Rise Mu, PA-C  predniSONE (DELTASONE) 10 MG tablet Take 4 tablets (40  mg total) by mouth daily for 5 days. 04/05/18 04/10/18  Victorian Gunn, Dahlia Client, PA-C    Family History Family History  Problem Relation Age of Onset  . Colon cancer Neg Hx   . Rectal cancer Neg Hx   . Esophageal cancer Neg Hx   . Liver cancer Neg Hx     Social History Social History   Tobacco Use  . Smoking status: Never Smoker  . Smokeless tobacco: Never Used  Substance Use Topics  . Alcohol use: No  . Drug use: No     Allergies   Patient has no known allergies.   Review of Systems Review of Systems  Constitutional: Negative for appetite change, diaphoresis, fatigue, fever and unexpected weight change.  HENT: Positive for congestion. Negative for mouth sores.   Eyes: Negative for visual disturbance.  Respiratory: Positive for cough, chest tightness and shortness of breath. Negative for wheezing.   Cardiovascular: Negative for chest pain.  Gastrointestinal: Negative for abdominal pain, constipation, diarrhea, nausea and vomiting.  Endocrine: Negative for polydipsia, polyphagia and polyuria.  Genitourinary: Negative for dysuria, frequency, hematuria and urgency.  Musculoskeletal: Negative for back pain and neck stiffness.  Skin: Negative for rash.  Allergic/Immunologic: Negative for immunocompromised state.  Neurological: Negative for syncope, light-headedness and headaches.  Hematological: Does not bruise/bleed easily.  Psychiatric/Behavioral: Negative for sleep disturbance. The patient is not nervous/anxious.      Physical Exam Updated Vital Signs BP 140/88 (  BP Location: Left Arm)   Pulse 79   Temp 98.1 F (36.7 C) (Oral)   Resp 18   Ht 6\' 3"  (1.905 m)   Wt 90.7 kg   SpO2 100%   BMI 25.00 kg/m   Physical Exam  Constitutional: He appears well-developed and well-nourished. No distress.  HENT:  Head: Normocephalic and atraumatic.  Right Ear: Tympanic membrane, external ear and ear canal normal.  Left Ear: Tympanic membrane, external ear and ear canal normal.   Nose: Mucosal edema and rhinorrhea present. No epistaxis. Right sinus exhibits no maxillary sinus tenderness and no frontal sinus tenderness. Left sinus exhibits no maxillary sinus tenderness and no frontal sinus tenderness.  Mouth/Throat: Uvula is midline and mucous membranes are normal. Mucous membranes are not pale and not cyanotic. No oropharyngeal exudate, posterior oropharyngeal edema, posterior oropharyngeal erythema or tonsillar abscesses.  Eyes: Pupils are equal, round, and reactive to light. Conjunctivae are normal.  Neck: Normal range of motion and full passive range of motion without pain.  Cardiovascular: Normal rate and intact distal pulses.  Pulmonary/Chest: Effort normal. No stridor. He has wheezes (inspiratory and expiratory throughout).  Clear and equal breath sounds without focal wheezes, rhonchi, rales  Abdominal: Soft. There is no tenderness.  Musculoskeletal: Normal range of motion.  Lymphadenopathy:    He has no cervical adenopathy.  Neurological: He is alert.  Skin: Skin is warm and dry. No rash noted. He is not diaphoretic.  Psychiatric: He has a normal mood and affect.  Nursing note and vitals reviewed.    ED Treatments / Results   Radiology Dg Chest 2 View  Result Date: 04/05/2018 CLINICAL DATA:  Shortness of breath. EXAM: CHEST - 2 VIEW COMPARISON:  04/02/2018 FINDINGS: Moderate central bronchial thickening.The cardiomediastinal contours are normal. Pulmonary vasculature is normal. No consolidation, pleural effusion, or pneumothorax. No acute osseous abnormalities are seen. IMPRESSION: Central bronchial thickening suggesting bronchitis or asthma. Electronically Signed   By: Narda Rutherford M.D.   On: 04/05/2018 00:56    Procedures Procedures (including critical care time)  Medications Ordered in ED Medications  albuterol (PROVENTIL HFA;VENTOLIN HFA) 108 (90 Base) MCG/ACT inhaler 2 puff (2 puffs Inhalation Given 04/05/18 0530)  albuterol (PROVENTIL) (2.5  MG/3ML) 0.083% nebulizer solution 5 mg (5 mg Nebulization Given 04/05/18 0022)  ondansetron (ZOFRAN-ODT) disintegrating tablet 4 mg (4 mg Oral Given 04/05/18 0322)  albuterol (PROVENTIL) (2.5 MG/3ML) 0.083% nebulizer solution 5 mg (5 mg Nebulization Given 04/05/18 0423)  ipratropium (ATROVENT) nebulizer solution 0.5 mg (0.5 mg Nebulization Given 04/05/18 0423)  predniSONE (DELTASONE) tablet 60 mg (60 mg Oral Given 04/05/18 0444)  AEROCHAMBER PLUS FLO-VU MEDIUM MISC 1 each (1 each Other Given 04/05/18 0529)     Initial Impression / Assessment and Plan / ED Course  I have reviewed the triage vital signs and the nursing notes.  Pertinent labs & imaging results that were available during my care of the patient were reviewed by me and considered in my medical decision making (see chart for details).     Patient presents with chest tightness and shortness of breath.  On initial exam he is wheezing even after initial albuterol.  Prednisone and second albuterol nebulizer given.  Patient denies a history of asthma however he is otherwise healthy.  No tachycardia or hypoxia.  Doubt pulmonary embolism.  Wheezing likely secondary to reactive airway disease.  Patient afebrile.  Chest x-ray is without evidence of pneumonia, pneumothorax or pulmonary edema.  I personally evaluated these images.  5:18 AM Patient  ambulated in ED with O2 saturations maintained >90, no current signs of respiratory distress. Lung exam improved after nebulizer treatment. Prednisone given in the ED and pt will be discharged with 5 day burst. Pt states they are breathing at baseline. Pt has been instructed to continue using prescribed medications and to speak with PCP about today's exacerbation.    Final Clinical Impressions(s) / ED Diagnoses   Final diagnoses:  Wheezing  SOB (shortness of breath)    ED Discharge Orders         Ordered    predniSONE (DELTASONE) 10 MG tablet  Daily     04/05/18 0520           Lorain Fettes,  Dahlia Client, PA-C 04/05/18 1610    Geoffery Lyons, MD 04/06/18 [phone]

## 2018-04-05 NOTE — ED Triage Notes (Signed)
Pt says for about 3 weeks he has had some difficulty breathing. No distress, no cough or fevers. Mild wheezing and rhonchi.

## 2018-04-05 NOTE — ED Notes (Signed)
Requested respiratory to bedside.

## 2018-04-05 NOTE — Discharge Instructions (Addendum)
1. Medications: albuterol, prednisone, usual home medications °2. Treatment: rest, drink plenty of fluids, begin OTC antihistamine (Zyrtec or Claritin)  °3. Follow Up: Please followup with your primary doctor in 2-3 days for discussion of your diagnoses and further evaluation after today's visit; if you do not have a primary care doctor use the resource guide provided to find one; Please return to the ER for difficulty breathing, high fevers or worsening symptoms. ° °

## 2018-04-05 NOTE — ED Notes (Signed)
On reassessment, pt c/o nausea. zofran offered

## 2018-04-28 ENCOUNTER — Encounter (HOSPITAL_COMMUNITY): Payer: Self-pay

## 2018-04-28 ENCOUNTER — Other Ambulatory Visit: Payer: Self-pay

## 2018-04-28 ENCOUNTER — Emergency Department (HOSPITAL_COMMUNITY)
Admission: EM | Admit: 2018-04-28 | Discharge: 2018-04-28 | Disposition: A | Discharge status: Home / Self Care | Payer: Medicaid Other | Attending: Emergency Medicine | Admitting: Emergency Medicine

## 2018-04-28 DIAGNOSIS — R251 Tremor, unspecified: Secondary | ICD-10-CM | POA: Insufficient documentation

## 2018-04-28 DIAGNOSIS — R110 Nausea: Secondary | ICD-10-CM | POA: Insufficient documentation

## 2018-04-28 LAB — CBC WITH DIFFERENTIAL/PLATELET
Abs Immature Granulocytes: 0.03 10*3/uL (ref 0.00–0.07)
Basophils Absolute: 0 10*3/uL (ref 0.0–0.1)
Basophils Relative: 0 %
Eosinophils Absolute: 0 10*3/uL (ref 0.0–0.5)
Eosinophils Relative: 0 %
HCT: 43.3 % (ref 39.0–52.0)
HEMOGLOBIN: 14.3 g/dL (ref 13.0–17.0)
IMMATURE GRANULOCYTES: 1 %
LYMPHS ABS: 1.7 10*3/uL (ref 0.7–4.0)
LYMPHS PCT: 28 %
MCH: 31.7 pg (ref 26.0–34.0)
MCHC: 33 g/dL (ref 30.0–36.0)
MCV: 96 fL (ref 80.0–100.0)
MONO ABS: 0.6 10*3/uL (ref 0.1–1.0)
MONOS PCT: 9 %
Neutro Abs: 3.9 10*3/uL (ref 1.7–7.7)
Neutrophils Relative %: 62 %
Platelets: 211 10*3/uL (ref 150–400)
RBC: 4.51 MIL/uL (ref 4.22–5.81)
RDW: 11.1 % — ABNORMAL LOW (ref 11.5–15.5)
WBC: 6.3 10*3/uL (ref 4.0–10.5)
nRBC: 0 % (ref 0.0–0.2)

## 2018-04-28 LAB — RAPID URINE DRUG SCREEN, HOSP PERFORMED
AMPHETAMINES: NOT DETECTED
Barbiturates: NOT DETECTED
Benzodiazepines: NOT DETECTED
Cocaine: NOT DETECTED
OPIATES: NOT DETECTED
Tetrahydrocannabinol: NOT DETECTED

## 2018-04-28 LAB — COMPREHENSIVE METABOLIC PANEL
ALK PHOS: 54 U/L (ref 38–126)
ALT: 25 U/L (ref 0–44)
ANION GAP: 10 (ref 5–15)
AST: 33 U/L (ref 15–41)
Albumin: 4.7 g/dL (ref 3.5–5.0)
BUN: 20 mg/dL (ref 6–20)
CO2: 23 mmol/L (ref 22–32)
CREATININE: 0.91 mg/dL (ref 0.61–1.24)
Calcium: 9.5 mg/dL (ref 8.9–10.3)
Chloride: 108 mmol/L (ref 98–111)
GFR calc non Af Amer: >60 mL/min (ref >60)
Glucose, Bld: 93 mg/dL (ref 70–99)
Potassium: 3.6 mmol/L (ref 3.5–5.1)
Sodium: 141 mmol/L (ref 135–145)
Total Bilirubin: 0.7 mg/dL (ref 0.3–1.2)
Total Protein: 7.7 g/dL (ref 6.5–8.1)

## 2018-04-28 LAB — URINALYSIS, ROUTINE W REFLEX MICROSCOPIC
Bacteria, UA: NONE SEEN
Bilirubin Urine: NEGATIVE
Glucose, UA: NEGATIVE mg/dL
KETONES UR: 5 mg/dL — AB
LEUKOCYTES UA: NEGATIVE
Nitrite: NEGATIVE
PROTEIN: NEGATIVE mg/dL
Specific Gravity, Urine: 1.032 — ABNORMAL HIGH (ref 1.005–1.030)
pH: 6 (ref 5.0–8.0)

## 2018-04-28 LAB — ETHANOL

## 2018-04-28 LAB — MAGNESIUM: MAGNESIUM: 1.9 mg/dL (ref 1.7–2.4)

## 2018-04-28 MED ORDER — SODIUM CHLORIDE 0.9 % IV BOLUS
1000.0000 mL | Freq: Once | INTRAVENOUS | Status: AC
  Administered 2018-04-28: 1000 mL via INTRAVENOUS

## 2018-04-28 MED ORDER — LORAZEPAM 2 MG/ML IJ SOLN
1.0000 mg | Freq: Once | INTRAMUSCULAR | Status: AC
  Administered 2018-04-28: 1 mg via INTRAVENOUS
  Filled 2018-04-28: qty 1

## 2018-04-28 NOTE — ED Triage Notes (Signed)
Patient arrived with friend via POV. Patient is AOx4 and ambulatory. Patient cc is he thinks he is having a seizure. Patient has no hx of seizures and denies ETOH or drug use. Patient stated the only symptom he has is nausea which started around 0600 this morning.

## 2018-04-28 NOTE — ED Provider Notes (Signed)
[facility]-EMERGENCY DEPT Provider Note   CSN: [id number] Arrival date & time: 04/28/18  0700     History   Chief Complaint Chief Complaint  Patient presents with  . Patient thinks he is having seizure    HPI Tyler Mcdowell is a 22 y.o. male.  Pt presents to the ED today with a possible seizure.  The pt said he started twitching at work.  The pt said he had some nausea as well.  He denies any f/c.     Past Medical History:  Diagnosis Date  . Anxiety   . Testicular torsion     Patient Active Problem List   Diagnosis Date Noted  . ADHD 10/23/2006    Past Surgical History:  Procedure Laterality Date  . WISDOM TOOTH EXTRACTION          Home Medications    Prior to Admission medications   Medication Sig Start Date End Date Taking? Authorizing Provider  cyclobenzaprine (FLEXERIL) 10 MG tablet Take 1 tablet (10 mg total) by mouth 2 (two) times daily as needed. Patient not taking: Reported on 04/28/2018 10/20/17   Demetrios Loll T, PA-C  lidocaine (LIDODERM) 5 % Place 1 patch onto the skin daily. Remove & Discard patch within 12 hours or as directed by MD Patient not taking: Reported on 04/28/2018 10/20/17   Demetrios Loll T, PA-C  naproxen (NAPROSYN) 375 MG tablet Take 1 tablet (375 mg total) by mouth 2 (two) times daily. Patient not taking: Reported on 04/28/2018 10/20/17   Rise Mu, PA-C    Family History Family History  Problem Relation Age of Onset  . Colon cancer Neg Hx   . Rectal cancer Neg Hx   . Esophageal cancer Neg Hx   . Liver cancer Neg Hx     Social History Social History   Tobacco Use  . Smoking status: Never Smoker  . Smokeless tobacco: Never Used  Substance Use Topics  . Alcohol use: No  . Drug use: No     Allergies   Patient has no known allergies.   Review of Systems Review of Systems  Neurological: Positive for tremors.  All other systems reviewed and are negative.    Physical  Exam Updated Vital Signs BP (!) 148/90   Pulse 69   Temp 97.7 F (36.5 C) (Oral)   Resp 14   Ht 6\' 3"  (1.905 m)   Wt 90.7 kg   SpO2 100%   BMI 24.99 kg/m   Physical Exam  Constitutional: He is oriented to person, place, and time. He appears well-developed and well-nourished.  HENT:  Head: Normocephalic and atraumatic.  Right Ear: External ear normal.  Left Ear: External ear normal.  Nose: Nose normal.  Mouth/Throat: Oropharynx is clear and moist.  Eyes: Pupils are equal, round, and reactive to light. Conjunctivae and EOM are normal.  Neck: Normal range of motion. Neck supple.  Cardiovascular: Normal rate, regular rhythm, normal heart sounds and intact distal pulses.  Pulmonary/Chest: Effort normal and breath sounds normal.  Abdominal: Soft. Bowel sounds are normal.  Musculoskeletal: Normal range of motion.  Neurological: He is alert and oriented to person, place, and time. He displays tremor.  Skin: Skin is warm. Capillary refill takes less than 2 seconds.  Psychiatric: He has a normal mood and affect. His behavior is normal. Judgment and thought content normal.  Nursing note and vitals reviewed.    ED Treatments / Results  Labs (all labs ordered are listed, but  only abnormal results are displayed) Labs Reviewed  CBC WITH DIFFERENTIAL/PLATELET - Abnormal; Notable for the following components:      Result Value   RDW 11.1 (*)    All other components within normal limits  URINALYSIS, ROUTINE W REFLEX MICROSCOPIC - Abnormal; Notable for the following components:   Specific Gravity, Urine 1.032 (*)    Hgb urine dipstick SMALL (*)    Ketones, ur 5 (*)    All other components within normal limits  COMPREHENSIVE METABOLIC PANEL  RAPID URINE DRUG SCREEN, HOSP PERFORMED  MAGNESIUM  ETHANOL    EKG EKG Interpretation  Date/Time:  Sunday April 28 2018 07:52:24 EDT Ventricular Rate:  67 PR Interval:    QRS Duration: 95 QT Interval:  396 QTC Calculation: 418 R  Axis:   19 Text Interpretation:  Sinus rhythm No significant change since last tracing Confirmed by Jacalyn Lefevre [phone]) on 04/28/2018 8:37:06 AM   Radiology No results found.  Procedures Procedures (including critical care time)  Medications Ordered in ED Medications  sodium chloride 0.9 % bolus 1,000 mL (1,000 mLs Intravenous New Bag/Given 04/28/18 0856)  LORazepam (ATIVAN) injection 1 mg (1 mg Intravenous Given 04/28/18 0859)     Initial Impression / Assessment and Plan / ED Course  I have reviewed the triage vital signs and the nursing notes.  Pertinent labs & imaging results that were available during my care of the patient were reviewed by me and considered in my medical decision making (see chart for details).    Pt is feeling much better after ativan.  Muscle tremors are gone.  Etiology of tremors are unclear.  He is instructed to return if worse and to f/u with pcp.  Final Clinical Impressions(s) / ED Diagnoses   Final diagnoses:  Tremor    ED Discharge Orders    None       Jacalyn Lefevre, MD 04/28/18 1039

## 2018-04-28 NOTE — ED Notes (Signed)
Patient complaining chills and nausea temperature  97.7 orally. Warm blanket given

## 2018-07-08 ENCOUNTER — Emergency Department (HOSPITAL_COMMUNITY)
Admission: EM | Admit: 2018-07-08 | Discharge: 2018-07-08 | Discharge status: Against Medical Advice | Payer: Medicaid Other

## 2018-07-08 NOTE — ED Notes (Signed)
Called for V/S x1 No response

## 2018-08-09 ENCOUNTER — Ambulatory Visit: Payer: Self-pay | Admitting: Family Medicine

## 2019-01-25 IMAGING — CT CT ABD-PELV W/ CM
2 of 4 series · 16 of 46 positions shown, 18 images · IV contrast (iopamidol)
Comparison: None.

CLINICAL DATA: 20-year-old male with periumbilical abdominal and
pelvic pain and weight loss.

EXAM:
CT ABDOMEN AND PELVIS WITH CONTRAST
TECHNIQUE: Multidetector CT imaging of the abdomen and pelvis was performed
using the standard protocol following bolus administration of
intravenous contrast.
CONTRAST:  100mL QNGTG7-EHH IOPAMIDOL (QNGTG7-EHH) INJECTION 61%

[Series 3: a/p w/ 5mm · axial · 0.75mm/px · z∈[+985,+1440]mm · 13 of 99 slices shown, 15 images]
[im 4/99  soft-tissue]
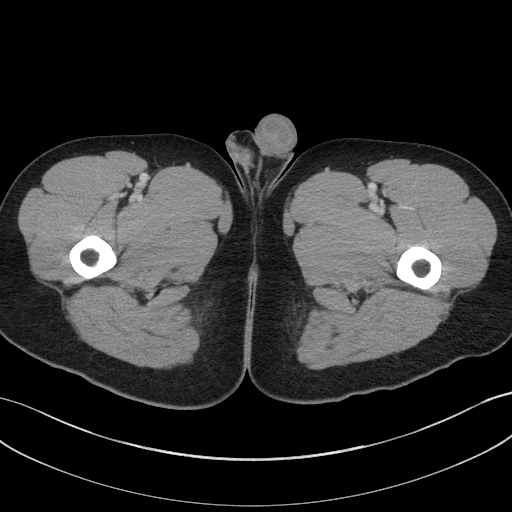
[im 4/99  bone]
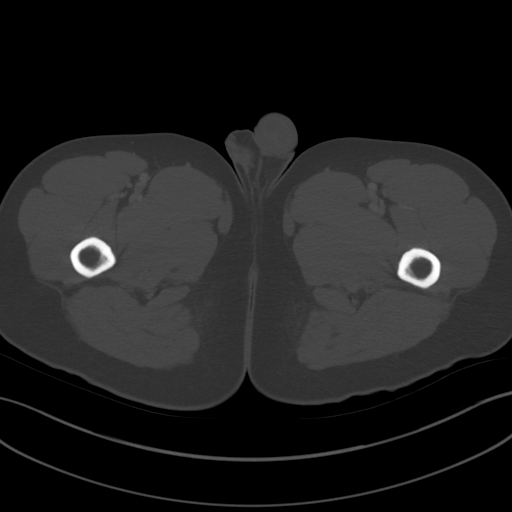
[im 12/99  soft-tissue]
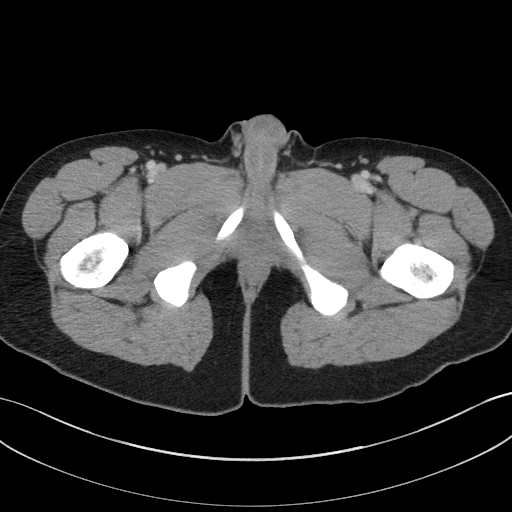
[im 20/99  soft-tissue]
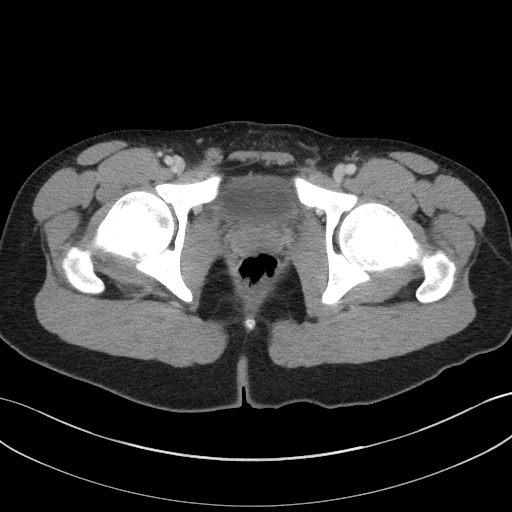
[im 28/99  soft-tissue]
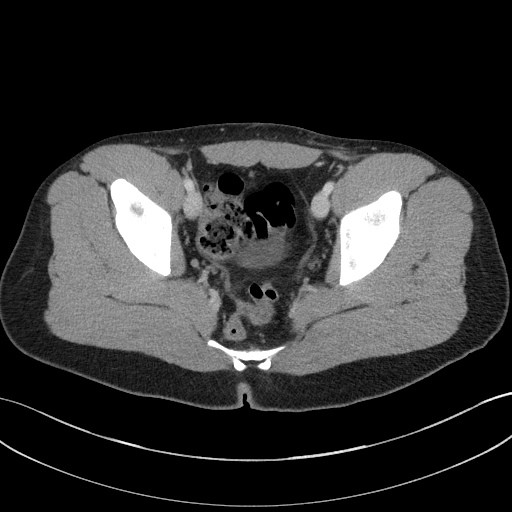
[im 36/99  soft-tissue]
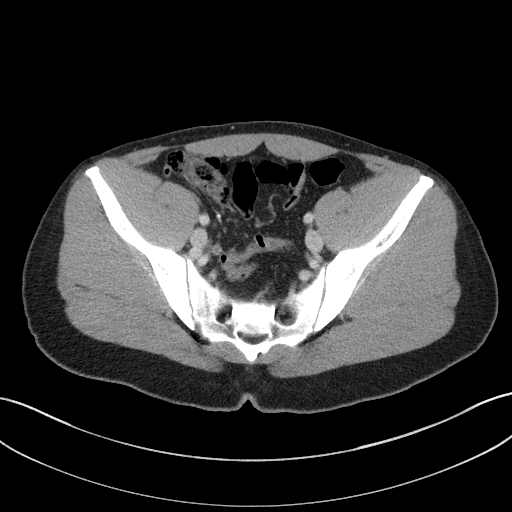
[im 44/99  soft-tissue]
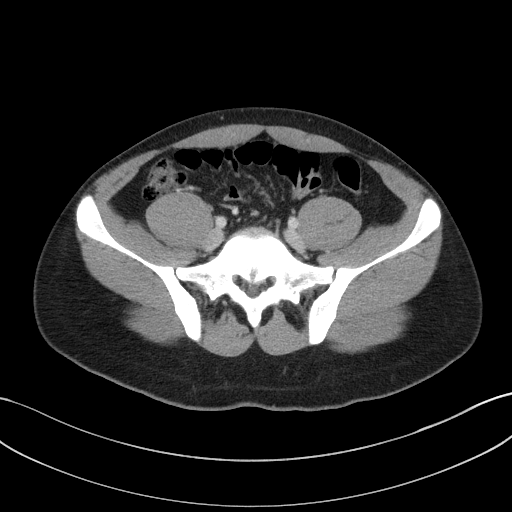
[im 51/99  soft-tissue]
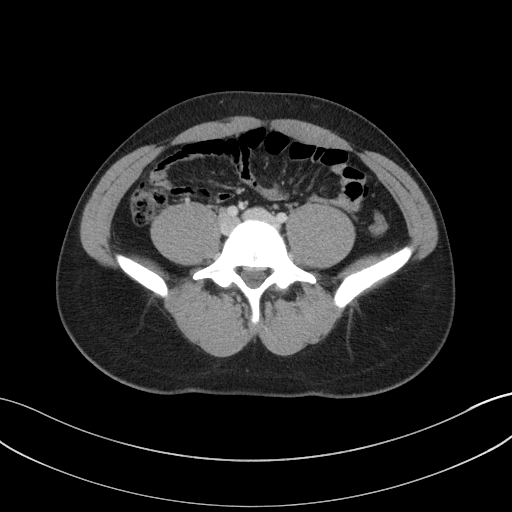
[im 55/99  soft-tissue]
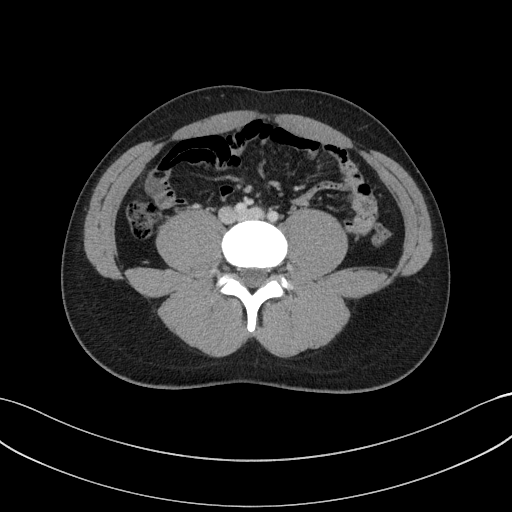
[im 63/99  soft-tissue]
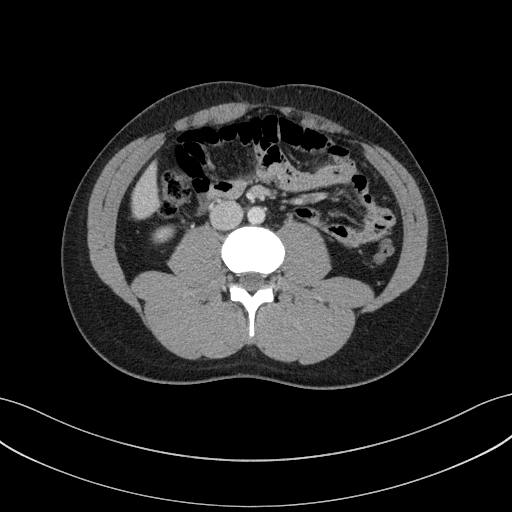
[im 63/99  bone]
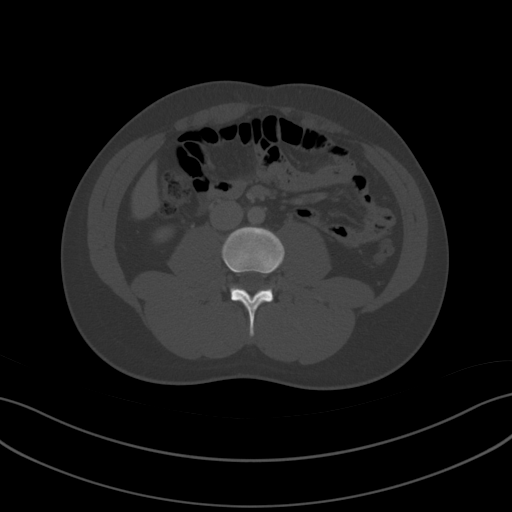
[im 71/99  soft-tissue]
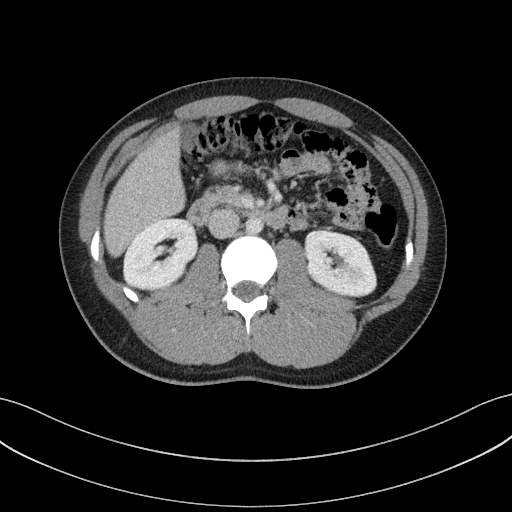
[im 79/99  soft-tissue]
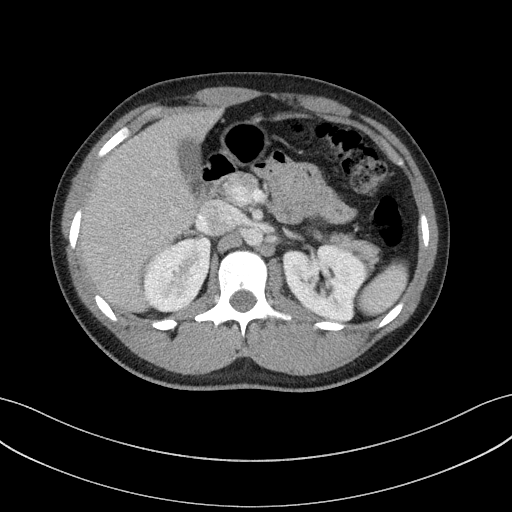
[im 87/99  soft-tissue]
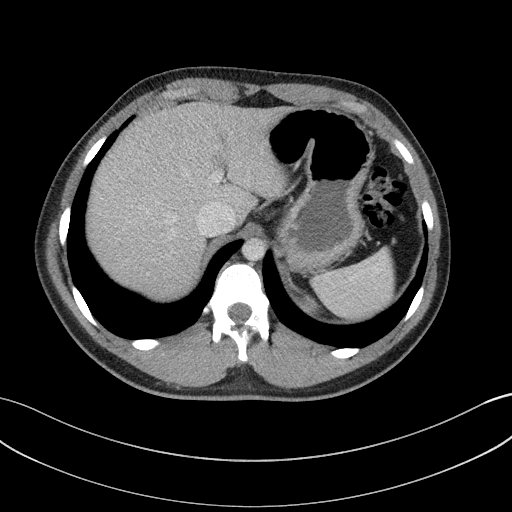
[im 95/99  soft-tissue]
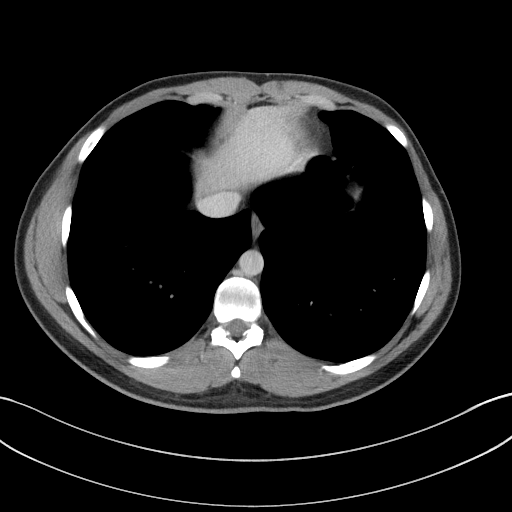

[Series 6: a/p w/ cor · coronal · 0.79mm/px · 3 of 136 slices shown]
[im 46/136  soft-tissue]
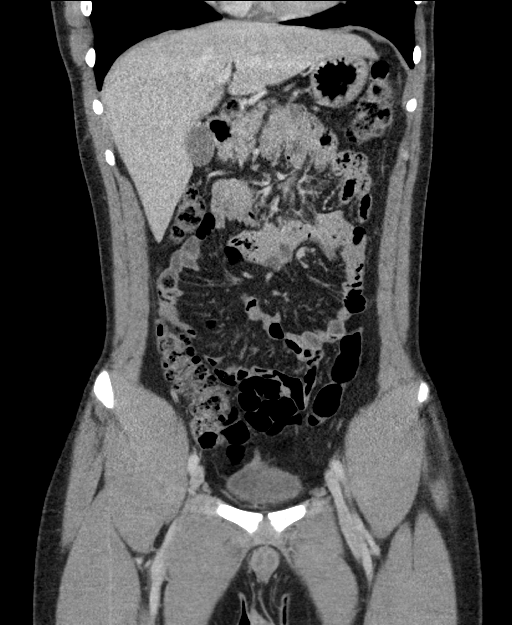
[im 61/136  soft-tissue]
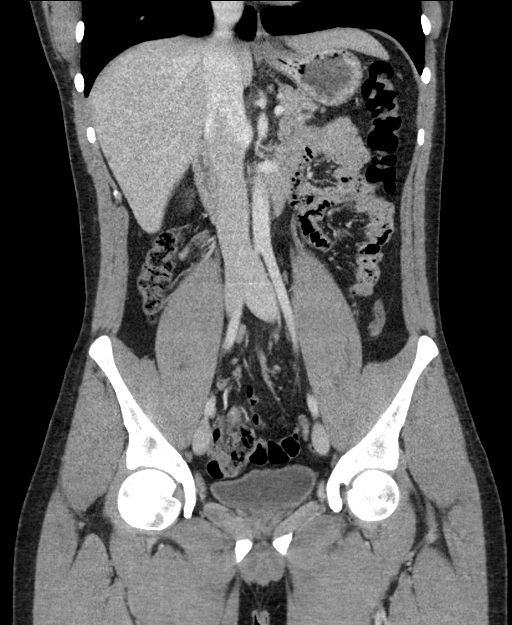
[im 76/136  soft-tissue]
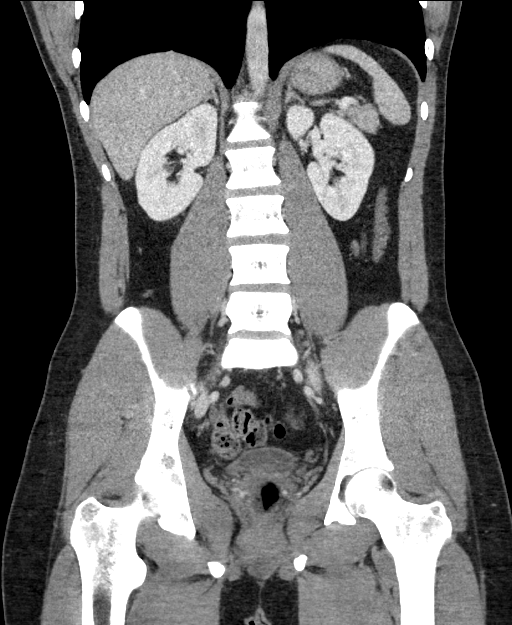

[16 of 46 positions shown; findings below may reference images not displayed]

FINDINGS: Lower chest: Unremarkable

Hepatobiliary: The liver and gallbladder are unremarkable. There is
no evidence of biliary dilatation.

Pancreas: Unremarkable

Spleen: Unremarkable

Adrenals/Urinary Tract: The kidneys, adrenal glands and bladder are
unremarkable.

Stomach/Bowel: Stomach is within normal limits. Appendix appears
normal. No evidence of bowel wall thickening, distention, or
inflammatory changes.

Vascular/Lymphatic: No significant vascular findings are present. No
enlarged abdominal or pelvic lymph nodes.

Reproductive: Prostate is unremarkable.

Other: No abdominal wall hernia or abnormality. No abdominopelvic
ascites.

Musculoskeletal: No acute or significant osseous findings.
IMPRESSION: Unremarkable CT of the abdomen and pelvis with contrast.

## 2019-09-26 ENCOUNTER — Ambulatory Visit: Payer: Medicaid Other | Attending: Internal Medicine

## 2019-09-26 DIAGNOSIS — Z23 Encounter for immunization: Secondary | ICD-10-CM

## 2019-09-26 NOTE — Progress Notes (Signed)
   Covid-[facility] Vaccination [facility]  Name:  CHANCY SMIGIEL    MRN: [id number] DOB: 10-28-1995  09/26/2019  Mr. Dray was observed post Covid-19 immunization for 15 minutes without incident. He was provided with Vaccine Information Sheet and instruction to access the V-Safe system.   Mr. Reep was instructed to call 911 with any severe reactions post vaccine: Marland Kitchen Difficulty breathing  . Swelling of face and throat  . A fast heartbeat  . A bad rash all over body  . Dizziness and weakness   Immunizations Administered    Name Date Dose VIS Date Route   Pfizer COVID-19 Vaccine 09/26/2019 12:54 PM 0.3 mL 06/13/2019 Intramuscular   Manufacturer: [employer], [employer]   Lot: [id number]   NDC: [address]-9

## 2019-10-21 ENCOUNTER — Ambulatory Visit: Payer: Medicaid Other | Attending: Internal Medicine

## 2019-10-21 DIAGNOSIS — Z23 Encounter for immunization: Secondary | ICD-10-CM

## 2019-10-21 NOTE — Progress Notes (Signed)
   Covid-19 Vaccination Clinic  Name:  Tyler Mcdowell    MRN: [id number] DOB: 11-09-1995  10/21/2019  Mr. Tyler Mcdowell was observed post Covid-19 immunization for 30 minutes based on pre-vaccination screening without incident. He was provided with Vaccine Information Sheet and instruction to access the V-Safe system.   Mr. Tyler Mcdowell was instructed to call 911 with any severe reactions post vaccine: Marland Kitchen Difficulty breathing  . Swelling of face and throat  . A fast heartbeat  . A bad rash all over body  . Dizziness and weakness   Immunizations Administered    Name Date Dose VIS Date Route   Pfizer COVID-19 Vaccine 10/21/2019  9:36 AM 0.3 mL 08/27/2018 Intramuscular   Manufacturer: [employer], [employer]   Lot: [id number]   NDC: [address]-6

## 2020-06-28 ENCOUNTER — Other Ambulatory Visit: Payer: Medicaid Other

## 2020-06-28 DIAGNOSIS — Z20822 Contact with and (suspected) exposure to covid-19: Secondary | ICD-10-CM

## 2020-06-30 LAB — SARS-COV-2, NAA 2 DAY TAT

## 2020-06-30 LAB — NOVEL CORONAVIRUS, NAA: SARS-CoV-2, NAA: NOT DETECTED

## 2020-07-01 IMAGING — DX DG CHEST 2V
2 series · 2 of 2 positions shown · non-contrast
Comparison: 09/10/2016

CLINICAL DATA: Shortness of Breath

EXAM:
CHEST - 2 VIEW

[dg chest 2 view (1 of 2)]
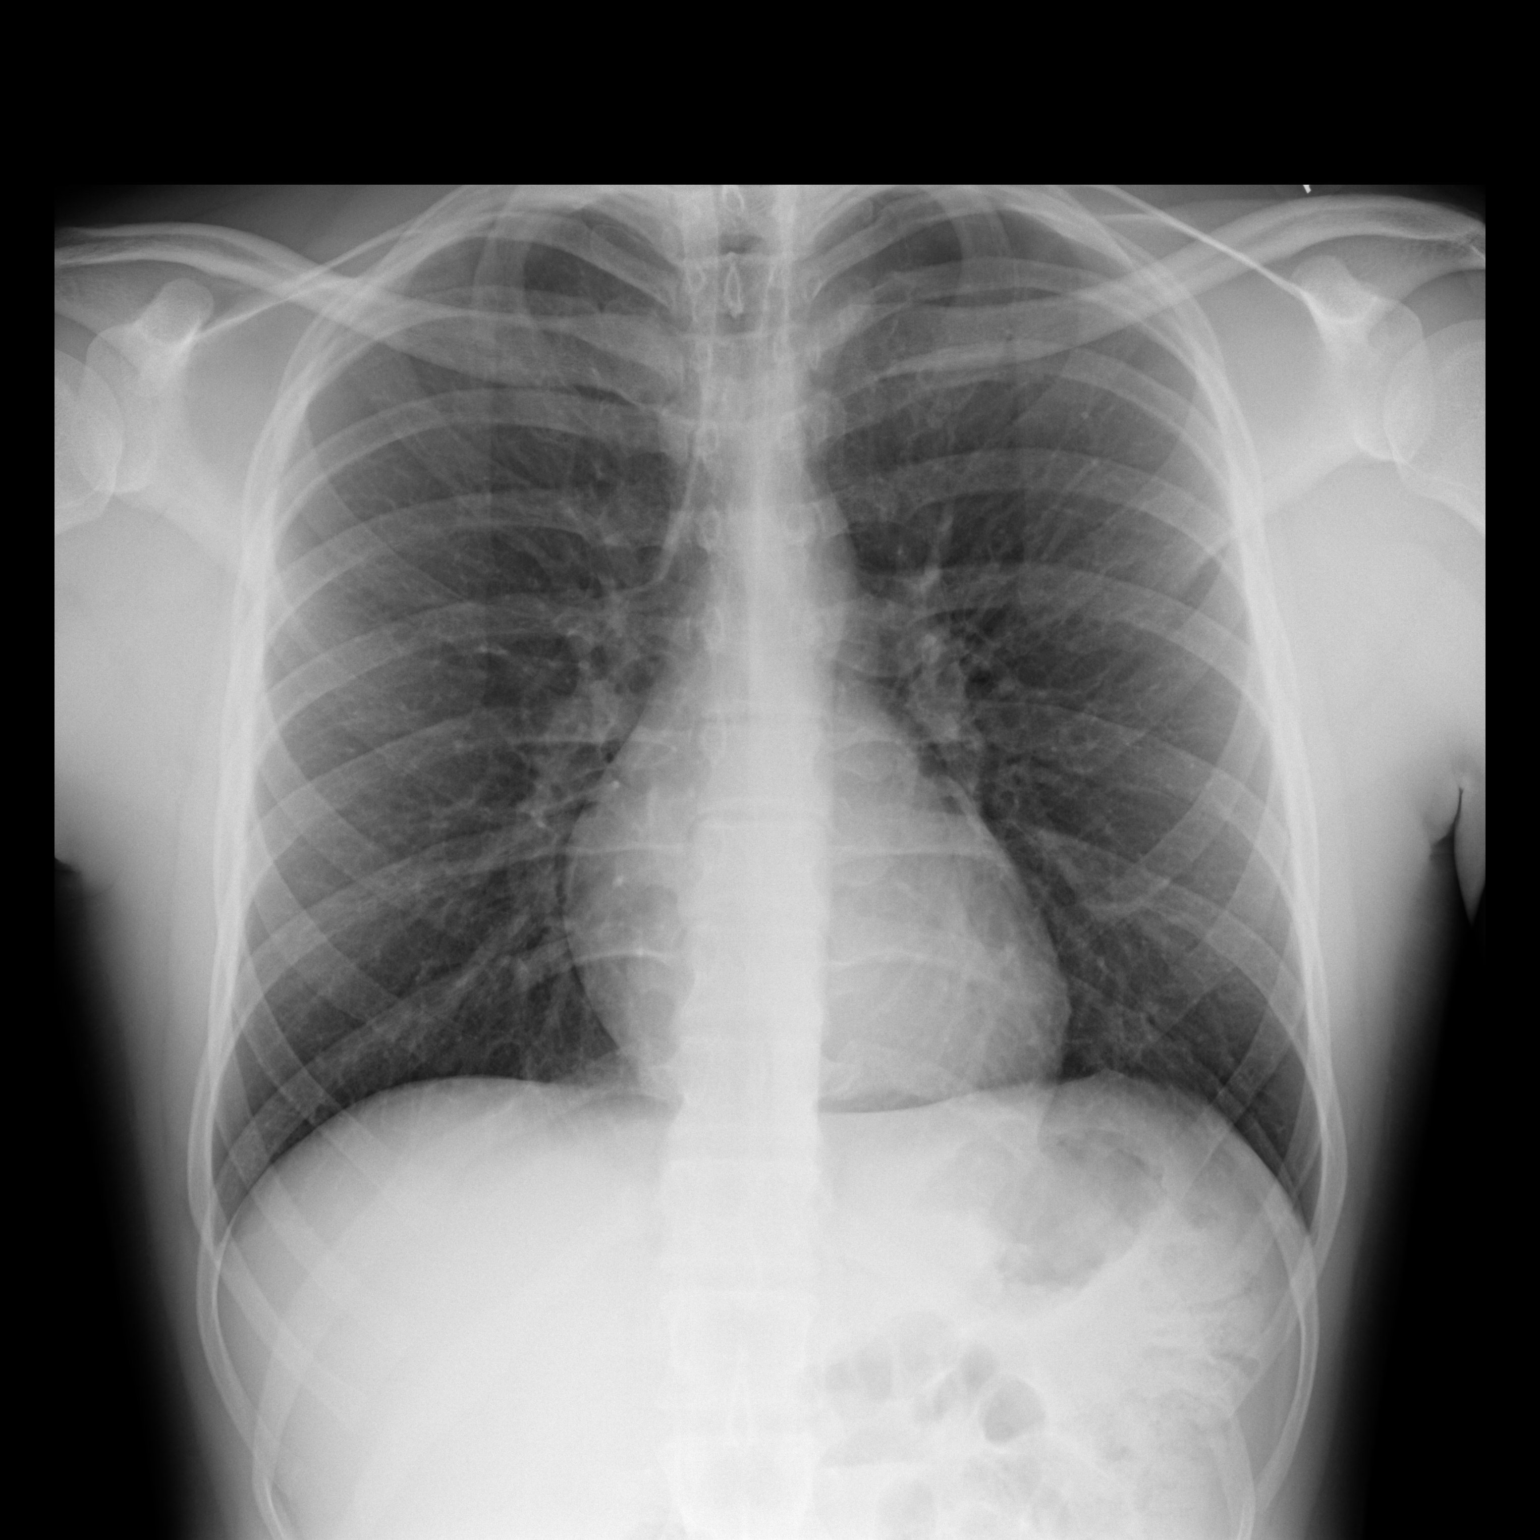

[dg chest 2 view (2 of 2)]
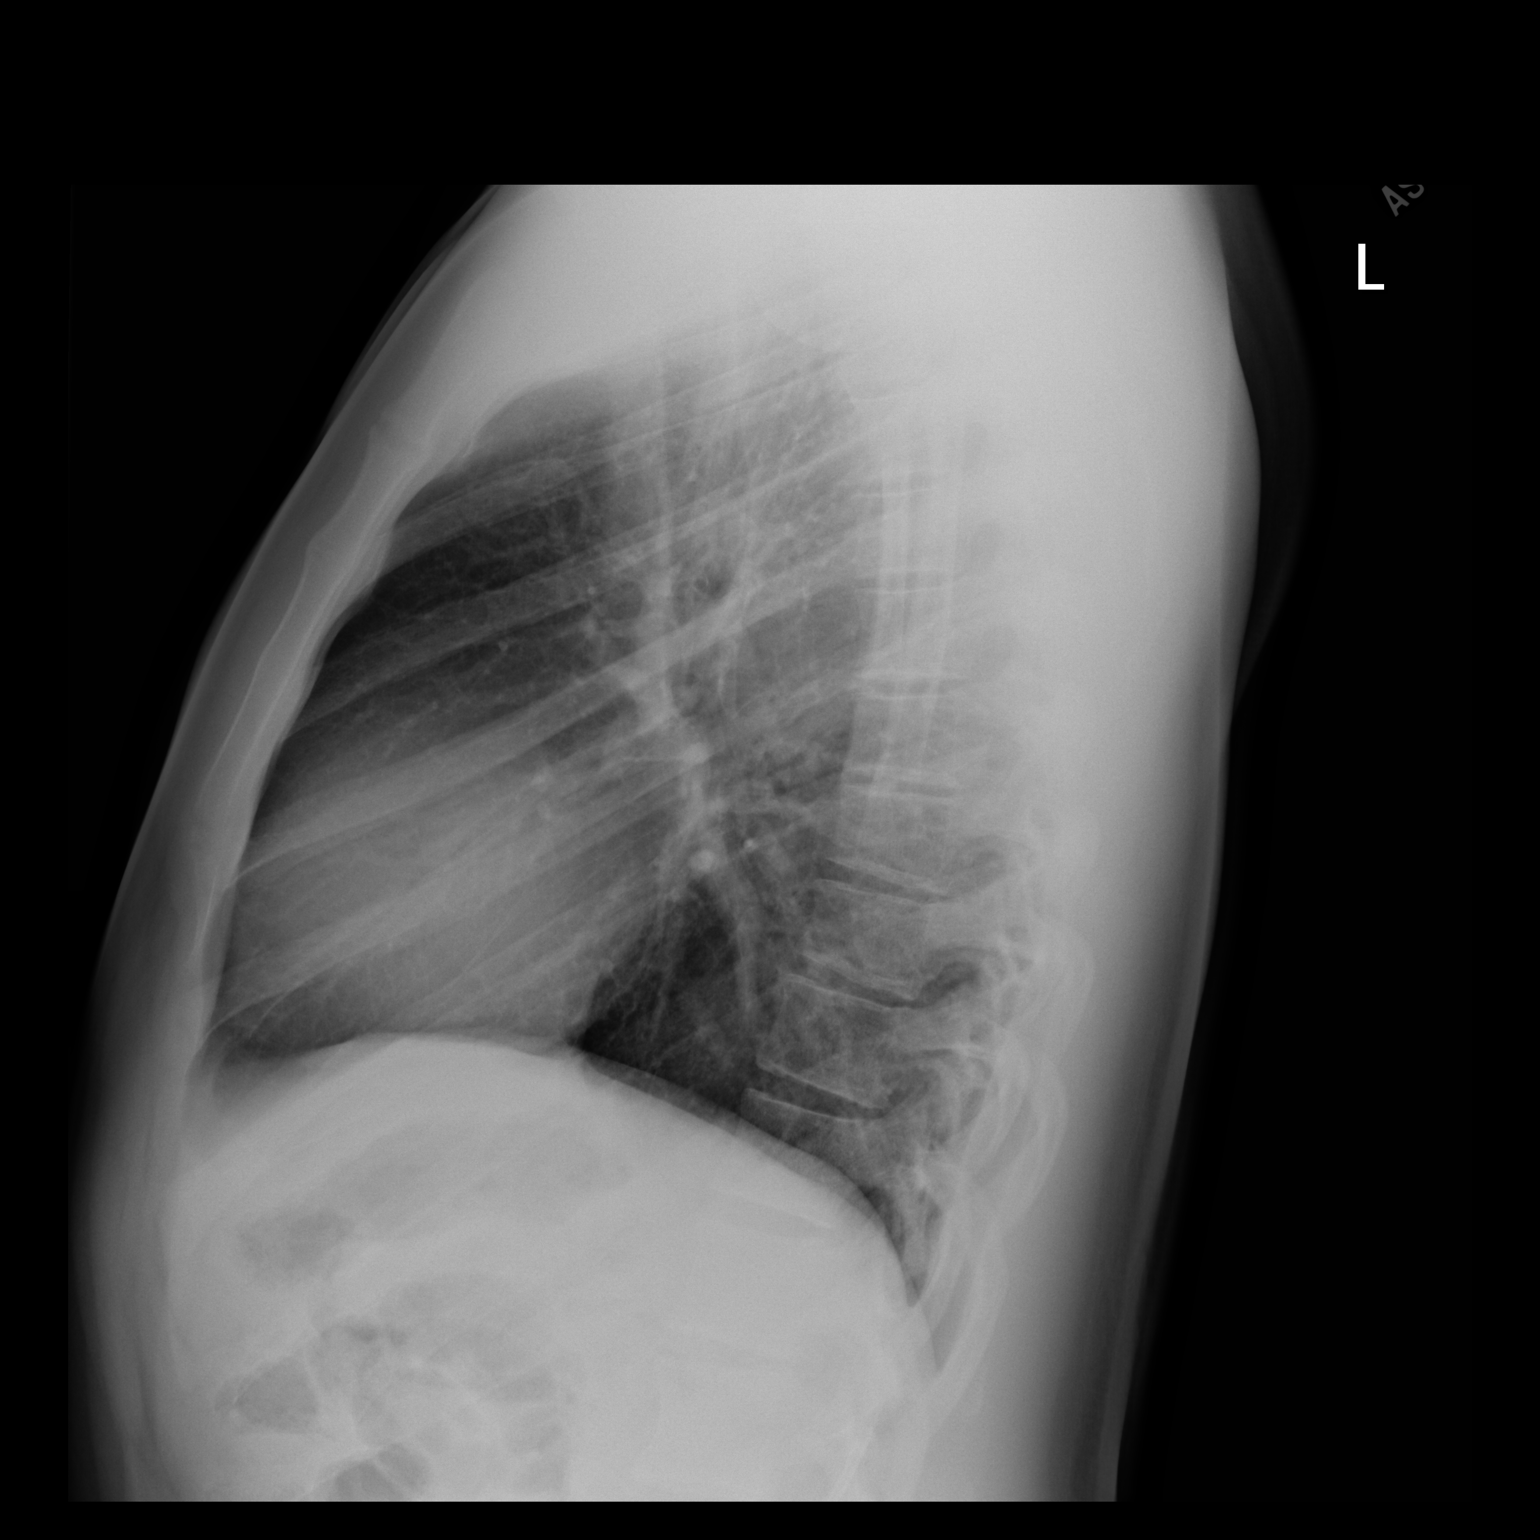

[2 of 2 positions shown; findings below may reference images not displayed]

FINDINGS: Heart and mediastinal contours are within normal limits. No focal
opacities or effusions. No acute bony abnormality.
IMPRESSION: No active cardiopulmonary disease.
